# Patient Record
Sex: Female | Born: 1960 | Race: White | Hispanic: Yes | Marital: Married | Smoking: Former smoker
Health system: Southern US, Community
[De-identification: ages and names within clinical notes are randomized; demographics above are authoritative.]

## PROBLEM LIST (undated history)

## (undated) HISTORY — PX: EYE SURGERY: SHX253

## (undated) HISTORY — PX: ABDOMINAL HYSTERECTOMY: SHX81

---

## 2004-08-21 ENCOUNTER — Ambulatory Visit (HOSPITAL_COMMUNITY): Admission: RE | Admit: 2004-08-21 | Discharge: 2004-08-21 | Payer: Self-pay | Admitting: Internal Medicine

## 2004-10-02 ENCOUNTER — Other Ambulatory Visit: Admission: RE | Admit: 2004-10-02 | Discharge: 2004-10-02 | Payer: Self-pay | Admitting: Gynecology

## 2005-10-07 ENCOUNTER — Other Ambulatory Visit: Admission: RE | Admit: 2005-10-07 | Discharge: 2005-10-07 | Payer: Self-pay | Admitting: Gynecology

## 2005-10-21 ENCOUNTER — Encounter: Admission: RE | Admit: 2005-10-21 | Discharge: 2005-10-21 | Payer: Self-pay | Admitting: Gynecology

## 2005-12-12 ENCOUNTER — Other Ambulatory Visit: Admission: RE | Admit: 2005-12-12 | Discharge: 2005-12-12 | Payer: Self-pay | Admitting: Gynecology

## 2006-04-02 ENCOUNTER — Other Ambulatory Visit: Admission: RE | Admit: 2006-04-02 | Discharge: 2006-04-02 | Payer: Self-pay | Admitting: Gynecology

## 2006-04-10 ENCOUNTER — Encounter (INDEPENDENT_AMBULATORY_CARE_PROVIDER_SITE_OTHER): Payer: Self-pay | Admitting: *Deleted

## 2006-04-10 ENCOUNTER — Ambulatory Visit (HOSPITAL_BASED_OUTPATIENT_CLINIC_OR_DEPARTMENT_OTHER): Admission: RE | Admit: 2006-04-10 | Discharge: 2006-04-10 | Payer: Self-pay | Admitting: Gynecology

## 2006-06-19 ENCOUNTER — Encounter (INDEPENDENT_AMBULATORY_CARE_PROVIDER_SITE_OTHER): Payer: Self-pay | Admitting: Specialist

## 2006-06-19 ENCOUNTER — Inpatient Hospital Stay (HOSPITAL_COMMUNITY): Admission: RE | Admit: 2006-06-19 | Discharge: 2006-06-21 | Payer: Self-pay | Admitting: Gynecology

## 2006-10-10 ENCOUNTER — Other Ambulatory Visit: Admission: RE | Admit: 2006-10-10 | Discharge: 2006-10-10 | Payer: Self-pay | Admitting: Gynecology

## 2006-11-18 ENCOUNTER — Ambulatory Visit (HOSPITAL_COMMUNITY): Admission: RE | Admit: 2006-11-18 | Discharge: 2006-11-18 | Payer: Self-pay | Admitting: Gynecology

## 2007-11-11 ENCOUNTER — Other Ambulatory Visit: Admission: RE | Admit: 2007-11-11 | Discharge: 2007-11-11 | Payer: Self-pay | Admitting: Gynecology

## 2007-11-20 ENCOUNTER — Ambulatory Visit (HOSPITAL_COMMUNITY): Admission: RE | Admit: 2007-11-20 | Discharge: 2007-11-20 | Payer: Self-pay | Admitting: Gynecology

## 2007-12-07 ENCOUNTER — Emergency Department (HOSPITAL_COMMUNITY): Admission: EM | Admit: 2007-12-07 | Discharge: 2007-12-07 | Payer: Self-pay | Admitting: Emergency Medicine

## 2008-01-21 ENCOUNTER — Emergency Department (HOSPITAL_COMMUNITY): Admission: EM | Admit: 2008-01-21 | Discharge: 2008-01-21 | Payer: Self-pay | Admitting: Emergency Medicine

## 2008-10-18 ENCOUNTER — Other Ambulatory Visit: Admission: RE | Admit: 2008-10-18 | Discharge: 2008-10-18 | Payer: Self-pay | Admitting: Gynecology

## 2008-10-18 ENCOUNTER — Ambulatory Visit: Payer: Self-pay | Admitting: Gynecology

## 2008-10-18 ENCOUNTER — Encounter: Payer: Self-pay | Admitting: Gynecology

## 2008-12-12 ENCOUNTER — Ambulatory Visit: Payer: Self-pay | Admitting: Gynecology

## 2008-12-13 ENCOUNTER — Ambulatory Visit (HOSPITAL_COMMUNITY): Admission: RE | Admit: 2008-12-13 | Discharge: 2008-12-13 | Payer: Self-pay | Admitting: Gynecology

## 2009-02-07 ENCOUNTER — Ambulatory Visit: Payer: Self-pay | Admitting: Gynecology

## 2009-10-20 ENCOUNTER — Other Ambulatory Visit: Admission: RE | Admit: 2009-10-20 | Discharge: 2009-10-20 | Payer: Self-pay | Admitting: Gynecology

## 2009-10-20 ENCOUNTER — Ambulatory Visit: Payer: Self-pay | Admitting: Gynecology

## 2009-10-30 ENCOUNTER — Ambulatory Visit: Payer: Self-pay | Admitting: Gynecology

## 2009-11-08 ENCOUNTER — Ambulatory Visit: Payer: Self-pay | Admitting: Gynecology

## 2009-11-17 ENCOUNTER — Ambulatory Visit: Payer: Self-pay | Admitting: Gynecology

## 2009-12-15 ENCOUNTER — Ambulatory Visit (HOSPITAL_COMMUNITY): Admission: RE | Admit: 2009-12-15 | Discharge: 2009-12-15 | Payer: Self-pay | Admitting: Gynecology

## 2010-10-07 ENCOUNTER — Encounter: Payer: Self-pay | Admitting: Gynecology

## 2010-12-20 ENCOUNTER — Other Ambulatory Visit (HOSPITAL_COMMUNITY): Payer: Self-pay | Admitting: Family Medicine

## 2010-12-20 DIAGNOSIS — Z1231 Encounter for screening mammogram for malignant neoplasm of breast: Secondary | ICD-10-CM

## 2011-01-03 ENCOUNTER — Ambulatory Visit (HOSPITAL_COMMUNITY)
Admission: RE | Admit: 2011-01-03 | Discharge: 2011-01-03 | Disposition: A | Discharge status: Home / Self Care | Payer: Self-pay | Source: Ambulatory Visit | Attending: Family Medicine | Admitting: Family Medicine

## 2011-01-03 DIAGNOSIS — Z1231 Encounter for screening mammogram for malignant neoplasm of breast: Secondary | ICD-10-CM

## 2011-02-01 NOTE — H&P (Signed)
NAMERomona Mccann           ACCOUNT NO.:  [id number]   MEDICAL RECORD NO.:  [id number]          PATIENT TYPE:  AMB   LOCATION:                                FACILITY:  [facility]   PHYSICIAN:  Juan H. Lily Peer, M.D.DATE OF BIRTH:  [birth year]   DATE OF ADMISSION:  06/19/2006  DATE OF DISCHARGE:                                HISTORY & PHYSICAL   CHIEF COMPLAINT:  Chronic pelvic pain.  Persistent right ovarian cyst.   HISTORY:  The patient had been seen in the office on numerous occasions due  to ongoing chronic pelvic pain. She had diagnostic laparoscopy on July 26.  The patient had been complaining of continued bleeding and also left lower  quadrant pain although she had a right ovarian cyst.  Attempted left ovarian  cystectomy on July 26 was unsuccessful.  The patient was found to have an  obliterated cul-de-sac, bilateral hydrosalpinx and serosal endometriosis and  pelvic adhesions.  For this reason the patient was rescheduled for this  coming date for a total abdominal hysterectomy, left salpingo-oophorectomy  and possible bilateral salpingo-oophorectomy.   PAST MEDICAL HISTORY:  1. She denies any allergies.  2. No longer taking Prilosec that she was taking for GERD in the past.  3. She has had four vaginal deliveries and two D and Cs for miscarriages.   PHYSICAL EXAMINATION:  GENERAL:  Well-developed, well-nourished female. She  weighs 138 pounds.  HEENT:  Unremarkable.  NECK: Supple. Trachea midline. No carotid bruits.  No thyromegaly.  LUNGS: Clear to auscultation without any rhonchi or wheezes.  HEART: Regular rate and rhythm. No murmurs or gallops.  BREAST EXAM:  Done at the time of her annual examination was unremarkable.  ABDOMEN:  Soft and nontender. No rebound with the exception of tenderness in  the left lower quadrant.  PELVIC EXAM:  Bartholin, urethral, Skene's gland within normal limits.  Vagina and cervix with no unusual discharge. Uterus is anteverted.  Tenderness is toward the left side. Rigid cul-de-sac on palpation.  RECTAL EXAM:  Not done recently.   ASSESSMENT:  A 50 year old gravida 6, para 4, abortion 2 with chronic pelvic  pain, left greater than right. Ultrasound on March 29 demonstrated complex  cyst measuring 2.7 x 1.4 x 2.7 cm suspicious for endometrioma.  Recent  laparoscopy and attempted left ovarian cystectomy unsuccessful due to the  extensive endometriosis. The patient with obliterated cul-de-sac, bilateral  hydrosalpinges and serosal endometriosis and pelvic adhesions.  The patient  had had an endometrial biopsy in July 2007 due to her menorrhagia and it was  benign findings.  The patient did receive in the office on August 10 Depo-  Provera 150 mg IM and was given a prescription for Motrin 800 mg t.i.d.  p.r.n. and she was given Megace 20 mg to take one p.o. b.i.d. for 10 days to  help taper off her bleeding. The patient was counseled as to the risks,  benefits and pros and cons of hysterectomy to include infection although she  will receive prophylactic antibiotic, the risk of deep venous thrombosis and  pulmonary embolism. She will have PSC stockings.  Due to the findings of  endometriosis the patient will be placed on a bowel prep 24 hours before her  surgery. The patient also knows the potential risk of trauma to internal  organs, to the bladder, intestines, ureters and other internal structures.  She is also fully aware that she could potentially lose both tubes and  ovaries due to the extent of the endometriosis if it is encountered and also  urologic consultation may be entertained where they may need to place  ureteral stents depending on how the dissection goes.  If she were to have  both ovaries removed, she understands that she may need to be placed on  hormone replacement therapy for a few years to combat vasomotor symptoms.  All the above were discussed with the patient in Spanish in detail. All  questions  were answered and will follow accordingly.   PLAN:  The patient is scheduled for total abdominal hysterectomy with left  salpingo-oophorectomy, possible bilateral salpingo-oophorectomy on Thursday,  October 4 at 7:30 at [facility].      Eva H. Lily Peer, M.D.  Electronically Signed     JHF/MEDQ  D:  06/18/2006  T:  06/18/2006  Job:  [id number]

## 2011-02-01 NOTE — Op Note (Signed)
NAMEZAFIRO, ROUTSON            ACCOUNT NO.:  [id number]   MEDICAL RECORD NO.:  [id number]          PATIENT TYPE:  AMB   LOCATION:  [facility]                         FACILITY:  [facility]   PHYSICIAN:  Juan H. Lily Peer, M.D.DATE OF BIRTH:  04-12-1961   DATE OF PROCEDURE:  04/10/2006  DATE OF DISCHARGE:                                 OPERATIVE REPORT   SURGEON:  Juan H. Lily Peer, M.D.   FIRST ASSISTANT:  Timothy P. Fontaine, M.D.   INDICATIONS FOR OPERATION:  50 year old gravida 6, para 4, ab 2, who  presented is being taken to the operating room secondary to chronic pelvic  pain, left greater than right and persistent right ovarian cyst.  The  patient decided to proceed with a concurrent tubal sterilization procedure.   PREOPERATIVE DIAGNOSIS:  1.  Chronic pelvic pain.  2.  Right ovarian cyst.   POSTOPERATIVE DIAGNOSIS:  1.  Chronic pelvic pain.  2.  Right ovarian cyst.   1.  Pelvic adhesions.  2.  Bilateral hydrosalpinx.  3.  Severe endometriosis of cul-de-sac.   ANESTHESIA:  General endotracheal anesthesia.   SURGEON:  Juan H. Lily Peer, M.D.   FIRST ASSISTANT:  Colin Broach, MD   PROCEDURE PERFORMED:  1.  Diagnostic laparoscopy.  2.  Lysis of pelvic adhesions.  3.  Pelvic washings.   FINDINGS:  The patient had a slightly retroverted uterus encased and thick  adhesions.  Omentum was adhered to the fundal posterior aspect of the  uterus, bilateral hydrosalpinx and obliterated cul-de-sac and superficial  evidence of endometriosis.   DESCRIPTION OF OPERATION:  After the patient was adequately counseled, she  was taken to the operating room where she underwent successful general  endotracheal anesthesia.  The patient had PSA stockings for DVT prophylaxis  and she received a gram of Mefoxin prophylactically as well.  The patient  was placed in low lithotomy position.  A Foley catheter was then placed to  monitor urinary output.  The abdomen was prepped and draped in  usual sterile  fashion.  A small stab incision was made under the umbilicus and a 10 mm  trocar was inserted under laparoscopic view.  The trocar was left in place  and a second puncture site was made with a 5 mm trocar in the local right  lower abdomen five fingerbreadths from the midline under laparoscopic  guidance.  The second port site was made with a 5 mm trocar on the  contralateral side under direct visualization and underneath the trocar site  it was noticed that there was a bleeder occurring probably from a branch of  the epigastric artery.  The area was contained first by cauterization  through the contralateral laparoscopic port and then placing a 10 mm Foley  catheter in through the port and inflating and putting tension on it while  the operation was continued.  This stopped the bleeding altogether.  Inspection of the pelvic cavity then demonstrated an obliterated cul-de-sac,  thick omental adhesions to the fundal aspect of the uterus.  This was freed  with the tripolar cauterization unit to be able to visualize the uterus and  at this point it was noted that she had bilateral hydrosalpinx that were  obliterated in the cul-de-sac and the ovaries and tubes could not be freed.  The superficial surface of the uterus gave the appearance of underlying  endometriosis.  Pelvic washings were obtained.  There was no further  procedure that could be done at this time.  The patient will be scheduled  for a hysterectomy which will be the appropriate procedure at this time at a  later date.  Once the pelvic cavity was copiously irrigated with normal  saline solution, the Foley catheter was deflated, inspection of the port  site demonstrated no further bleeding.  The pneumoperitoneum that had been  placed initially during the operation was decreased and looking under direct  visualization, there was no bleeding.  Pressure was placed on the port site  and did not trigger any further  bleeding.  The abdomen was reinflated again.  The area inspected and once again deflated one more time under laparoscopic  view and there was no further bleeding.  After the CO2 was removed from the  pelvic cavity.  The instruments were removed.  At the left lower port site  skin was closed with interrupted sutures of 4-0 plain catgut suture and the  other three port sites were closed with Dermabond glue  0.25%Marcaine was  infiltrated for postoperative analgesia for total 10 mL.  The Hulka  tenaculum was removed that was placed initially before the operation to  manipulate the uterus during the laparoscopic procedure and the Foley  catheter was removed and the patient was transferred to recovery in stable  vital signs.  Her blood loss was 50 mL and IV fluids consisted of 1400 mL of  lactated Ringer's.      Juan H. Lily Peer, M.D.  Electronically Signed     JHF/MEDQ  D:  04/10/2006  T:  04/10/2006  Job:  [id number]

## 2011-02-01 NOTE — H&P (Signed)
[name], [name]            ACCOUNT NO.:  [id number]   MEDICAL RECORD NO.:  [id number]          PATIENT TYPE:   LOCATION:                                 FACILITY:   PHYSICIAN:  Juan H. Lily Peer, M.D.     DATE OF BIRTH:   DATE OF ADMISSION:  04/10/2006  DATE OF DISCHARGE:                                HISTORY & PHYSICAL   CHIEF COMPLAINT:  Chronic pelvic pin with persistent right ovarian cyst.   HISTORY:  The patient is a 50 year old gravida 6, para 4, AB2, who was seen  in the office for her annual gynecological examination in January of 2007.  The patient has been followed with serial ultrasounds secondary to chronic  pelvic pain.  She also was seen in the office on Feb 12, 2006 for a  preoperative consultation.  Back in March of this year, she had had some  irregular bleeding.  Some endometrial biopsy had been done, which was  normal, and her Pap smear, as well, had been normal.  The patient continued  to complain of dyspareunia and pelvic pain, but ironically, the patient  stated most of her pains on her left adnexa, although the persistent ovarian  cyst is on her right, which measures 2.7 x 1.4 x 2.7 cm.  The patient  decided to proceed with definitive surgery such as right ovarian cystectomy,  possible left salpingo-oophorectomy, possible bilateral salpingo-  oophorectomy with possible TAH/BSO.   PAST MEDICAL HISTORY:  1.  She denies any allergies.  2.  No longer taking the Prilosec that she was taking for GERD.  3.  She has had 4 vaginal deliveries and 2 D&Cs for miscarriages.   The patient can decide before the time of her surgery if she wants to  proceed also with laparoscopic sterilization at the same time as the  laparoscopy.   PHYSICAL EXAMINATION:  VITAL SIGNS:  The patient weighs 138 pounds.  HEENT:  Unremarkable.  NECK:  Supple.  Trachea midline.  No carotid bruits, no thyromegaly.  LUNGS:  Clear to auscultation without any rhonchi or wheezes.  HEART:   Regular rate and rhythm.  No murmurs or gallops.  BREAST EXAM:  Done at the time of her annual exam was unremarkable.  ABDOMEN:  Soft.  Some mild tenderness in the left lower quadrant, but no  rebound or guarding.  RECTAL:  Unremarkable.   ASSESSMENT:  A 50 year old gravida 6, para 4, AB2 with chronic pelvic pain,  left greater than right, persistent right ovarian cyst.  Scheduled to  undergo laparoscopic right ovarian cystectomy.  The patient to decide before  surgery if she wants to proceed with definitive sterilization to be  performed at the time of the laparoscopic cystectomy.  Suspect that probably  the pain in her left side and pelvis may be attributed to endometriosis.  If  that is encountered at that time, we will treat it with laser.  The patient  potentially could result in losing her right tube and ovary or both tubes  and ovaries or total abdominal hysterectomy/bilateral salpingo-oophorectomy.  The risk of the operation to include  infection, although she will receive  prophylactic antibiotics, the risk for DVT and subsequent pulmonary embolism  were also discussed.  The patient will have PSA stockings, as well.  Also,  in the event that the patient would need a blood transfusion, the potential  risk of anaphylactic reaction, hepatitis and AIDS were discussed, and  finally, in the event of any trauma to internal organs requiring correction,  may require an open laparotomy, of it technically the procedure is not able  to be completed laparoscopically, an open laparotomy technique may need to  be utilized, and will require the patient to stay in the hospital an extra  day.  All this was explained to the patient in Spanish.  All questions were  answered, and will follow accordingly.   PLAN:  The patient is scheduled for:  1.  Diagnostic laparoscopy.  2.  Right ovarian cystectomy.  3.  The patient to decide for bilateral tubal sterilization at the time of      presentation to  the hospital.      Gaetano Hawthorne. Lily Peer, M.D.  Electronically Signed     JHF/MEDQ  D:  04/10/2006  T:  04/10/2006  Job:  [id number]

## 2011-02-01 NOTE — Op Note (Signed)
NAMETRISTIN, GLADMAN            ACCOUNT NO.:  [id number]   MEDICAL RECORD NO.:  [id number]          PATIENT TYPE:  INP   LOCATION:  9310                          FACILITY:  [facility]   PHYSICIAN:  Juan H. Lily Peer, M.D.DATE OF BIRTH:  [birth year]   DATE OF PROCEDURE:  06/19/2006  DATE OF DISCHARGE:                                 OPERATIVE REPORT   SURGEON:  Juan H. Lily Peer, M.D.   ASSISTANT:  Rande Brunt. Eda Paschal, M.D.   INDICATIONS FOR OPERATION:  A 50 year old gravida 3, para 3, with a history  of chronic pelvic pain, especially left lower quadrant.  History of right  ovarian cyst.  Recent laparoscopy on July 26 demonstrated extensive stage IV  endometriosis with bilateral hydrosalpinx.  The patient being admitted today  to undergo a total abdominal hysterectomy with left salpingo-oophorectomy,  with bilateral salpingo-oophorectomy, for extensive stage IV endometriosis.   PREOPERATIVE DIAGNOSES:  1. Chronic pelvic pain.  2. Endometriosis.  3. Right ovarian cyst.   POSTOPERATIVE DIAGNOSIS:  1. Chronic pelvic pain.  2. Endometriosis.  3. Right ovarian cyst.  4. Pelvic adhesions.  5. Hydrosalpinx.   PROCEDURE PERFORMED:  Total abdominal hysterectomy with bilateral salpingo-  oophorectomy and extensive pelvic adhesiolysis.   ANESTHESIA:  General endotracheal anesthesia.   FINDINGS:  Patient with segments of small bowel adhered to the fundal region  of the uterus, bilateral hydrosalpinx.  Both ovaries were encased in thick  adhesions, retrouterine and pelvic sidewall.  Patient with apparent  hemosiderin deposits on the exterior surface of the uterine serosa,  especially in the anterior fundal region.  Normal-appearing appendix and  obliterated slightly obliterated cul-de-sac.   DESCRIPTION OF OPERATION:  After the patient was adequately counseled, she  was taken to the operating room, where she underwent a successful general  endotracheal anesthesia.  She had PSA stockings  for DVT prophylaxis and  received 2 g of cefoxitin IV.  The night before, the patient had had a bowel  prep due to the planned extensive dissection that could be required due to  the previously-diagnosed endometriosis on laparoscopy.  A Foley catheter was  in place to monitor urinary output.  The abdomen was prepped and draped in a  sterile fashion.  After general endotracheal anesthesia had been obtained, a  Pfannenstiel skin incision was made 2 cm above the symphysis pubis.  The  incision was carried down through the skin and subcutaneous tissue down to  the rectus fascia, whereby midline nick was made.  The fascia was incised in  a transverse fashion.  The peritoneal cavity was entered cautiously.  The  O'Connor retractors were placed after meticulous dissection.  A segment of  omentum that had found adhered to the fundus of the uterus was well as the  left pelvic sidewall had been freed.  Once the retractors were in place, a  systematic inspection demonstrated that both ovaries were encased in thick  adhesions, which were uterine and to the pelvic sidewall.  Also, hemosiderin  deposits were noted on the surface of the serosa of the uterus and the  fundus.  Attention was placed to the  left round ligament, which was  transected.  The anterior broad ligament was incised to the level of the  internal cervical os.  The left ureter was identified, then meticulous  dissection of the parametrial tissue, keeping the ureter identified at all  times.  An umbilical tape was used to demarcate the left ureter away from  the dissection plane so that the infundibulopelvic ligament could be clamped  and transected.  The Gyrus open-seal forceps were utilized with the  generator set at 60 watts for the left infundibulopelvic ligament, and this  was clamped and cauterized x2 and then transected with the scissors.  The  remainder of the broad ligament and cardinal ligaments were serially  clamped,  cauterized and cut to the level of left uterine artery.  A similar  procedure was carried out on the contralateral side.  With the use of  Jorgenson scissors, the cervix was excised from the vagina and the cervix,  tubes and ovary were passed off the operative field.  Both angles were  secured with a figure-of-eight of 0 Vicryl suture and the remaining cuff was  secured with interrupted sutures of 0 Vicryl suture as well.  The pelvic  cavity was copiously irrigated with normal saline solution and small area  near the cuff was oozing, and three Hemoclips were used to contain it at all  times, keeping close attention to the peristalsis of the ureter being away  from this area as well.  Once hemostasis was established, sponge count and  needle count were correct.  Appendix was inspected and appeared to be  normal.  The pelvic cavity had been copiously irrigated with normal saline  solution.  Again, the sponge count and needle count were correct.  The  visceral peritoneum was not closed, but the rectus fascia was closed with a  running stitch of 0 Vicryl suture.  The subcutaneous bleeders were Bovie  cauterized and the skin was reapproximated with skin clips, followed by  placing Xeroform gauze, a 4 x 8 dressing.  The patient was extubated,  transferred to the recovery room with stable vital signs.  Blood loss from  the procedure was 200 mL.  IV fluids 1800 mL of lactated Ringer's.  Urine  output 300 mL and clear.  She did have PSA stockings for DVT prophylaxis and  received 2 g of cefoxitin IV prophylactically as well.      Juan H. Lily Peer, M.D.  Electronically Signed     JHF/MEDQ  D:  06/19/2006  T:  06/20/2006  Job:  [id number]

## 2011-02-01 NOTE — Discharge Summary (Signed)
[name], [name]            ACCOUNT NO.:  [id number]   MEDICAL RECORD NO.:  [id number]          PATIENT TYPE:  INP   LOCATION:  9310                          FACILITY:  [facility]   PHYSICIAN:  Timothy P. Fontaine, M.D.DATE OF BIRTH:  08/06/1961   DATE OF ADMISSION:  06/19/2006  DATE OF DISCHARGE:  06/21/2006                                 DISCHARGE SUMMARY   DISCHARGE DIAGNOSES:  1. Chronic pelvic pain.  2. Endometriosis.  3. Hydrosalpinx bilaterally.  4. Pelvic adhesive disease.   PROCEDURES:  1. Total abdominal hysterectomy.  2. Bilateral salpingo-oophorectomy.  3. Lysis of pelvic adhesions.  Dr. Reynaldo Minium June 19, 2006,      pathology pending.   HOSPITAL COURSE:  A 50 year old G45, P4 female underwent uncomplicated total  abdominal hysterectomy, bilateral salpingo-oophorectomy, lysis of pelvic  adhesions June 19, 2006.  The patient's postoperative course was  uncomplicated.  She was discharged on postoperative day #2 ambulating well,  tolerating a regular diet, voiding without difficulty with a postoperative  hemoglobin of 10.9.  The patient received precautions, instructions and  follow-up.  Will be seen in the office 2-3 days following discharge for  removal of her staples.  She was instructed to call the office to schedule  this appointment.  She was given discharge prescriptions per Dr. Reynaldo Minium to include Lortab 7.5 #30 one p.o. q.4-6h. p.r.n. pain, Climara  transdermal patch 0.1 mg #4 one patch weekly and Reglan 10 mg #25 one p.o.  q.4-6h. p.r.n. nausea.      Timothy P. Fontaine, M.D.  Electronically Signed     TPF/MEDQ  D:  06/21/2006  T:  06/22/2006  Job:  [id number]

## 2011-06-10 LAB — POCT I-STAT, CHEM 8
BUN: 14
Calcium, Ion: 1.24
Chloride: 104
Creatinine, Ser: 0.7
Glucose, Bld: 118 — ABNORMAL HIGH
HCT: 46
Hemoglobin: 15.6 — ABNORMAL HIGH
Potassium: 3.3 — ABNORMAL LOW
Sodium: 140
TCO2: 26

## 2011-06-10 LAB — POCT CARDIAC MARKERS
CKMB, poc: 1.4
CKMB, poc: 1.5
Myoglobin, poc: 31.3
Myoglobin, poc: 41.5
Operator id: 272551
Operator id: 277751
Troponin i, poc: <0.05
Troponin i, poc: <0.05

## 2012-01-28 ENCOUNTER — Other Ambulatory Visit (HOSPITAL_COMMUNITY): Payer: Self-pay | Admitting: Geriatric Medicine

## 2012-01-28 DIAGNOSIS — Z1231 Encounter for screening mammogram for malignant neoplasm of breast: Secondary | ICD-10-CM

## 2012-02-26 ENCOUNTER — Ambulatory Visit (HOSPITAL_COMMUNITY)
Admission: RE | Admit: 2012-02-26 | Discharge: 2012-02-26 | Disposition: A | Discharge status: Home / Self Care | Payer: Self-pay | Source: Ambulatory Visit | Attending: Geriatric Medicine | Admitting: Geriatric Medicine

## 2012-02-26 DIAGNOSIS — Z1231 Encounter for screening mammogram for malignant neoplasm of breast: Secondary | ICD-10-CM

## 2012-11-27 ENCOUNTER — Other Ambulatory Visit (HOSPITAL_COMMUNITY): Payer: Self-pay | Admitting: Geriatric Medicine

## 2012-11-27 DIAGNOSIS — Z1231 Encounter for screening mammogram for malignant neoplasm of breast: Secondary | ICD-10-CM

## 2012-12-11 ENCOUNTER — Other Ambulatory Visit (HOSPITAL_COMMUNITY): Payer: Self-pay | Admitting: Geriatric Medicine

## 2012-12-11 ENCOUNTER — Ambulatory Visit (HOSPITAL_COMMUNITY)
Admission: RE | Admit: 2012-12-11 | Discharge: 2012-12-11 | Disposition: A | Discharge status: Home / Self Care | Payer: Self-pay | Source: Ambulatory Visit | Attending: Geriatric Medicine | Admitting: Geriatric Medicine

## 2012-12-11 DIAGNOSIS — Z1231 Encounter for screening mammogram for malignant neoplasm of breast: Secondary | ICD-10-CM

## 2013-03-01 ENCOUNTER — Ambulatory Visit (HOSPITAL_COMMUNITY)
Admission: RE | Admit: 2013-03-01 | Discharge: 2013-03-01 | Disposition: A | Discharge status: Home / Self Care | Payer: Self-pay | Source: Ambulatory Visit | Attending: Geriatric Medicine | Admitting: Geriatric Medicine

## 2013-03-01 DIAGNOSIS — Z1231 Encounter for screening mammogram for malignant neoplasm of breast: Secondary | ICD-10-CM

## 2014-02-09 ENCOUNTER — Other Ambulatory Visit (HOSPITAL_COMMUNITY): Payer: Self-pay | Admitting: Geriatric Medicine

## 2014-02-24 ENCOUNTER — Other Ambulatory Visit (HOSPITAL_COMMUNITY): Payer: Self-pay | Admitting: Geriatric Medicine

## 2014-02-24 DIAGNOSIS — Z1231 Encounter for screening mammogram for malignant neoplasm of breast: Secondary | ICD-10-CM

## 2014-03-02 ENCOUNTER — Ambulatory Visit (HOSPITAL_COMMUNITY)
Admission: RE | Admit: 2014-03-02 | Discharge: 2014-03-02 | Disposition: A | Discharge status: Home / Self Care | Payer: Self-pay | Source: Ambulatory Visit | Attending: Geriatric Medicine | Admitting: Geriatric Medicine

## 2014-03-02 DIAGNOSIS — Z1231 Encounter for screening mammogram for malignant neoplasm of breast: Secondary | ICD-10-CM

## 2015-03-07 ENCOUNTER — Other Ambulatory Visit (HOSPITAL_COMMUNITY): Payer: Self-pay | Admitting: Specialist

## 2015-03-07 DIAGNOSIS — Z1231 Encounter for screening mammogram for malignant neoplasm of breast: Secondary | ICD-10-CM

## 2015-03-14 ENCOUNTER — Ambulatory Visit (HOSPITAL_COMMUNITY)
Admission: RE | Admit: 2015-03-14 | Discharge: 2015-03-14 | Disposition: A | Discharge status: Home / Self Care | Payer: Self-pay | Source: Ambulatory Visit | Attending: Specialist | Admitting: Specialist

## 2015-03-14 DIAGNOSIS — Z1231 Encounter for screening mammogram for malignant neoplasm of breast: Secondary | ICD-10-CM

## 2016-03-05 ENCOUNTER — Other Ambulatory Visit: Payer: Self-pay | Admitting: Obstetrics and Gynecology

## 2016-03-05 DIAGNOSIS — Z1231 Encounter for screening mammogram for malignant neoplasm of breast: Secondary | ICD-10-CM

## 2016-03-15 ENCOUNTER — Ambulatory Visit
Admission: RE | Admit: 2016-03-15 | Discharge: 2016-03-15 | Disposition: A | Discharge status: Home / Self Care | Payer: No Typology Code available for payment source | Source: Ambulatory Visit | Attending: Obstetrics and Gynecology | Admitting: Obstetrics and Gynecology

## 2016-03-15 ENCOUNTER — Ambulatory Visit (HOSPITAL_COMMUNITY)
Admission: RE | Admit: 2016-03-15 | Discharge: 2016-03-15 | Disposition: A | Discharge status: Home / Self Care | Payer: Self-pay | Source: Ambulatory Visit | Attending: Obstetrics and Gynecology | Admitting: Obstetrics and Gynecology

## 2016-03-15 ENCOUNTER — Encounter (HOSPITAL_COMMUNITY): Payer: Self-pay

## 2016-03-15 VITALS — BP 118/80 | Temp 97.8°F | Ht 62.0 in | Wt 145.6 lb

## 2016-03-15 DIAGNOSIS — Z1231 Encounter for screening mammogram for malignant neoplasm of breast: Secondary | ICD-10-CM

## 2016-03-15 DIAGNOSIS — Z1239 Encounter for other screening for malignant neoplasm of breast: Secondary | ICD-10-CM

## 2016-03-15 NOTE — Patient Instructions (Signed)
Educational material on self breast awareness given. Explained to [name] did not need a Pap smear today due to patient has a history of a hysterectomy for benign reasons. Let patient know that she doesn't need any further Pap smears due to her history of a hysterectomy for benign reasons. [facility]. Appointment scheduled for Friday, March 15, 2016 at 1010. Let patient know the Breast Center will follow up with her within the next couple weeks with results of mammogram by letter or phone. [name] verbalized understanding.  Jethro Radke, Kathaleen Maserhristine Poll, RN 10:48 AM

## 2016-03-15 NOTE — Progress Notes (Signed)
Complaints of right back pain that radiates to her arm and then her breast that started yesterday. Patient stated she was mopping yesterday and the pain started afterwards. Patient rates pain at a 5 out of 10.  Pap Smear:  Pap smear not completed today. Last Pap smear was in 2007 and normal per patient. Per patient has no history of an abnormal Pap smear. Patient has a history of a hysterectomy 06/19/2006 for endometriosis. Patient no longer needs Pap smears due to her history of a hysterectomy for benign reasons per BCCCP and ACOG guidelines.  Physical exam: Breasts Breasts symmetrical. No skin abnormalities bilateral breasts. No nipple retraction bilateral breasts. No nipple discharge bilateral breasts. No lymphadenopathy. No lumps palpated bilateral breasts. No complaints of pain or tenderness on exam. [facility] for [facility] screening mamm[facility]. Appointment scheduled for Friday, March 15, 2016 at 1010.  Pelvic/Bimanual No Pap smear completed today since patient has history of a hysterectomy for benign reasons. Pap smear not indicated per BCCCP guidelines.   Smoking History: Patient has never smoked.  Patient Navigation: Patient education provided. Access to services provided for [facility] through [facility]. Spanish interpreter provided.  Colorectal Cancer Screening: Patient has never had a colonoscopy. No complaints today..  Used Spanish interpreter [employer] Gal[employer] from [employer].

## 2016-03-18 ENCOUNTER — Encounter (HOSPITAL_COMMUNITY): Payer: Self-pay | Admitting: *Deleted

## 2016-03-27 ENCOUNTER — Telehealth: Payer: Self-pay

## 2016-03-27 NOTE — Telephone Encounter (Signed)
Callled per Albertina SenegalMarly Adams to remind of [facility] 03[facility]. Patient stated would be there.

## 2016-03-29 ENCOUNTER — Ambulatory Visit: Payer: Self-pay

## 2016-03-29 ENCOUNTER — Other Ambulatory Visit: Payer: Self-pay

## 2016-03-29 VITALS — BP 122/88 | HR 60 | Temp 97.9°F | Resp 16 | Ht 59.84 in | Wt 147.0 lb

## 2016-03-29 DIAGNOSIS — Z0000 Encounter for general adult medical examination without abnormal findings: Secondary | ICD-10-CM

## 2016-03-29 LAB — LIPID PANEL
Chol/HDL Ratio: 4.5 ratio units — ABNORMAL HIGH (ref 0.0–4.4)
Cholesterol, Total: 254 mg/dL — ABNORMAL HIGH (ref 100–199)
HDL: 57 mg/dL (ref >39)
LDL Calculated: 159 mg/dL — ABNORMAL HIGH (ref 0–99)
Triglycerides: 189 mg/dL — ABNORMAL HIGH (ref 0–149)
VLDL Cholesterol Cal: 38 mg/dL (ref 5–40)

## 2016-03-29 NOTE — Progress Notes (Signed)
[facility] and is currently a [facility] patient effective 03/15/2016. Patient is accompanied by interpreter Albertina SenegalMarly Adams.  Clinical Measurements: Patient is 4 ft. 11.8 inches, weight 147 lbs, and BMI 28.9.   Medical History: Patient has no history of high cholesterol. Patient does not have a history of hypertension or diabetes. Per patient no diagnosed history of coronary heart disease, heart attack, heart failure, stroke/TIA, vascular disease or congenital heart defects.   Blood Pressure, Self-measurement: Patient states has no reason to check Blood pressure.  Nutrition Assessment: Patient stated that eats 2 to 3 fruits every day. Patient states she eats 2 servings of vegetables a day. Per patient does eat 3 or more ounces of whole grains daily. Patient doesn't eat two or more servings of fish weekly.  Patient states she does not drink more than 36 ounces or 450 calories of beverages with added sugars weekly. Patient states she drinks a lot of water. Patient stated she does  watch her salt intake.   Physical Activity Assessment: Patient stated she walks 5 miles 3 days a week. Patient stated has probably been doing 180 minutes of moderate exercise a week and no vigorous exercise.  Smoking Status: Patient stated that she sporadically smokes and is not exposed to smoke.  Quality of Life Assessment: In assessing patient's physical quality of life she stated that out of the past 30 days that she has felt her health was good all days. Patient also stated that in the past 30 days that her mental health was good including stress, depression and problems with emotions for all days. Patient did state that out of the past 30 days she felt her physical or mental health had not kept her from doing her usual activities including self-care, work or recreation for all days.   Plan: Lab work will be done today including a lipid panel and Hgb A1C. Will call lab results when they  are finished. Patient will receive Health Coaching for risk reduction initially and then as patient needs.  FIRST HEALTH COACH: During initial screening with Albertina SenegalMarly Adams interpreter present, wellness health coaching was started.  Nutrition: Discussed with patient that normally should eat 2 to 3 servings of fruits and 2 to 21/2 of vegetables a day with a serving being 1/2 cup or small piece. Informed patient that two servings of fish were recommended per week, being ones with omega threes. Listed the fishes with omega three's for patient.Discussed that should have 5 to 7 ounces of grains a day. Explained to patient that should eat fruits and vegetables high in fiber but 3 of her 5 to 7 grains of fiber should be whole grain. Examples of whole grains were given.   Informed about sugar which patient is doing. Discussed decreasing salt intake by mainly adding some while cook and none at table.  Physical Activity:  Patient was Informed that minimal moderate activity is 150 minutes a week or 75 minutes of vigorous. Patient is doing her minimal moderate exercise. Discussed adding in some flexeibily and balance exercises.  PLAN: Will set goal when call lab results and do second health coaching. Will think about what goals may want to set.

## 2016-03-29 NOTE — Patient Instructions (Addendum)
Discussed health assessment with patient. Will increase omega threes in daily patterns. She will be called with results of lab work and we will then discussed any further follow up the patient needs. Patient verbalized understanding.

## 2016-03-30 LAB — HEMOGLOBIN A1C
Est. average glucose Bld gHb Est-mCnc: 120 mg/dL
Hemoglobin A1c: 5.8 % — ABNORMAL HIGH (ref 4.8–5.6)

## 2016-04-01 ENCOUNTER — Telehealth: Payer: Self-pay

## 2016-04-01 NOTE — Telephone Encounter (Signed)
LAB RESULTS: Patient called per [name] interpreter to inform about lab work from 03/29/16. Interpreter informed patient: cholesterol- 254, HDL- 57, LDL- 159, triglycerides - 161189,  HBG-A1C - 5.8, and BMI 28.9.  DOCTOR's APPOINTMENT: [facility] at 3 pM for elevated blood lipid panel. Reassured patient that doctor's appointment is concerning Lipid panel results. Informed that would leave packet at reception with Facility address, infomation and telephone number given. Patient was informed not to let doctor do any other lab work or injections. [facility] does not cover and patient would be billed.   SECOND HEALTH COACHING: Did risk reduction counseling/Heach Coach concerning related to BMI/Weight, cholesterol, Triglycerides, and LDL's. Patient and I discussed her eating and exercise. Discussed serving sizes. Discussed not to eat over 6 to 7 serving sizes per day. Need to decrease sugar and eat food higher fiber. Need to measure and know what serving sizes are.  NAVIGATION NEEDS ASSESSMENT AND CARE PLAN: Patient does haven support of husband but does need additional social support. Patient is not aware of services in the area so has a lack access to services.Patient understands why she has to be followed up for Lipid Panel results. Only other barrier may be that patient is not aware of some services to assist the low income and language deficit.  PLAN: Patient will attend doctor's appointment. Will call patient back after appointment to see how appointment went. Patient and I will discuss more health coaching concerning patients exercise and weight loss goals

## 2016-04-05 ENCOUNTER — Ambulatory Visit (INDEPENDENT_AMBULATORY_CARE_PROVIDER_SITE_OTHER): Payer: Self-pay | Admitting: Student in an Organized Health Care Education/Training Program

## 2016-04-05 ENCOUNTER — Encounter: Payer: Self-pay | Admitting: Student in an Organized Health Care Education/Training Program

## 2016-04-05 VITALS — BP 116/93 | HR 81 | Temp 98.3°F | Ht 60.0 in | Wt 148.0 lb

## 2016-04-05 DIAGNOSIS — R7303 Prediabetes: Secondary | ICD-10-CM

## 2016-04-05 DIAGNOSIS — E789 Disorder of lipoprotein metabolism, unspecified: Secondary | ICD-10-CM | POA: Insufficient documentation

## 2016-04-05 NOTE — Assessment & Plan Note (Addendum)
ASCVD Risk estimated 1.6%; based on 10 year risk not in statin benefit group - patient education on elevated cholesterol, long-term risks and management -  Lifestyle changes are indicated at this time; patient given information on high-cholesterol food to avoid - Patient will attempt implement exercise regimen: walk 30 mins x 3d/week

## 2016-04-05 NOTE — Progress Notes (Signed)
   CC: Follow up abnormal result on cholesterol screen  HPI: [name] is a 55 y.o. female [facility] after having elevated cholesterol and A1C on screening tests.  Patient is asymptomatic.  She denies chest pain, dyspnea on exertion, or claudication.  No vision changes, polyuria, polydipsia by history.  She does not exercise.    Patient denies any history of medical conditions, reports she takes no medications.   Review of Symptoms: See HPI for ROS.   CC, SH/smoking status, and VS noted. Patient is a non-smoker.   Objective: BP 116/93 mmHg  Pulse 81  Temp(Src) 98.3 F (36.8 C) (Oral)  Ht 5' (1.524 m)  Wt 148 lb (67.132 kg)  BMI 28.90 kg/m2 GEN: NAD, alert, cooperative, and pleasant. RESPIRATORY: clear to auscultation bilaterally with no wheezes, rhonchi or rales, good effort CV: RRR, no m/r/g, no peripheral edema GI: soft, non-tender, non-distended SKIN: warm and dry PSYCH: AAOx3, appropriate affect  Assessment and plan:  Abnormal cholesterol test ASCVD Risk estimated 1.6%; based on 10 year risk not in statin benefit group - patient education on elevated cholesterol, long-term risks and management -  Lifestyle changes are indicated at this time; patient given information on high-cholesterol food to avoid - Patient will attempt implement exercise regimen: walk 30 mins x 3d/week   Prediabetes HbA1C 5.8 on screening test. - No pharmacologic intervention indicated at this time   - Patient educated on diabetes long-term risks and her A1C results - Discussed management with lifestyle changes - diet and exercise - Used "My Plate" educational tool to help patient with diet decisions going forward    No orders of the defined types were placed in this encounter.    No orders of the defined types were placed in this encounter.     Howard PouchLauren Dustin Bumbaugh, MD,MS,  PGY1 04/05/2016 4:30 PM

## 2016-04-05 NOTE — Assessment & Plan Note (Addendum)
HbA1C 5.8 on screening test. - No pharmacologic intervention indicated at this time   - Patient educated on diabetes long-term risks and her A1C results - Discussed management with lifestyle changes - diet and exercise - Used "My Plate" educational tool to help patient with diet decisions going forward

## 2016-04-05 NOTE — Patient Instructions (Addendum)
It was a pleasure seeing you today in our clinic. Today we discussed cholesterol and diabetes. Here is the treatment plan we have discussed and agreed upon together: - incorporate more exercise into your week, try to exercise 30 minutes 3 days per week. - we talked about dietary changes to lower cholesterol, and also improve your HbA1C. - please ask for a meeting with financial counseling to set up your orange card so that we can continue to follow you for health maintenance.  Our clinic's number is [phone]. Feel free with questions or concern about what we discussed today.   - Dr. Estill BattenFeng     Colesterol (Cholesterol) El colesterol esuna grasa. El organismo necesita una pequea cantidad de colesterol. El colesterol puede acumularse en los vasos sanguneos, lo que aumenta la probabilidad de tener un ataque cardaco o un ictus. Los niveles de colesterol no pueden percibirse. La nica forma de saber que el colesterol est alto es mediante un anlisis de Waurikasangre. Guarde los CDW Corporationresultados del anlisis. Trabaje con el mdico para Engineer, maintenance (IT)mantener el colesterol en un nivel adecuado. QU SIGNIFICAN LOS RESULTADOS DEL ANLISIS?  El colesterol total es la cantidad de colesterol que hay en la Westwaysangre.  El LDL es el colesterol Ripleymalo, y es el tipo que puede acumularse. Lo deseable es mantenerlo bajo.  El HDL es el colesterol bueno. Limpia los vasos sanguneos y elimina el colesterol LDL. Lo deseable es que el colesterol HDL sea alto.  Los triglicridos son grasas que el organismo puede Evening Shadequemar, ya sea para energa o para almacenamiento. CULES SON LOS NIVELES BUENOS DE COLESTEROL?  El colesterol total por debajo de 200.  El LDL por debajo de 100 en las personas que corren riesgo. Por debajo de 70 en las personas que corren un riesgo muy alto.  El HDL por encima de 50 es aceptable. Por encima de 60 es mejor.  Los triglicridos por debajo de 150. CMO PUEDO BAJAR EL COLESTEROL?  Dieta. Siga los programas  de alimentacin que el mdico le indique.  Elija el pescado, la carne blanca de pollo, el pavo asado u horneado. Intente no comer carnes rojas, alimentos fritos o carnes procesadas, como salchichas y embutidos.  Coma gran cantidad de frutas y verduras frescas.  Elija los cereales integrales, los frijoles, las pastas, las papas y los cereales.  Use solamente pequeas cantidades de aceite de oliva, maz o canola.  Intente no comer Faywoodmantequilla, Oberlinmayonesa, Indiamargarina o aceites de Harperpalmiste.  Intente no comer alimentos que contengan grasas trans.  Beba leche descremada o sin grasa. Coma yogur y quesos bajos en Steffanie Rainwatergrasa o sin grasa. Intente no consumir leche entera o crema. Intente no comer helados, yemas de huevo y quesos enteros.  Los postres sanos incluyen la torta ngel, los bocadillos de Dundeejengibre, las Gaffergalletas con forma de Winonaanimales, los caramelos duros, los helados de agua y el yogur bajo en grasa o sin grasa. Intente no comer masas, tortas, pasteles y galletas.  Haga actividad fsica. Siga el programa de actividad fsica segn las indicaciones del mdico.  Sea ms Overtonactivo. Puede intentar hacer jardinera, hacer caminatas o subir las escaleras. Consulte al mdico cmo puede aumentar la Hooperactividad.  Medicamentos. Tome los United Parcelmedicamentos como le indic su mdico.   Esta informacin no tiene Theme park managercomo fin reemplazar el consejo del mdico. Asegrese de hacerle al mdico cualquier pregunta que tenga.   Document Released: 10/05/2010 Document Revised: 09/23/2014 Elsevier Interactive Patient Education [employer]2016 Elsevier Inc.

## 2016-04-28 ENCOUNTER — Encounter (HOSPITAL_COMMUNITY): Payer: Self-pay

## 2016-05-08 ENCOUNTER — Ambulatory Visit: Payer: No Typology Code available for payment source | Attending: Internal Medicine

## 2016-05-20 ENCOUNTER — Emergency Department (HOSPITAL_COMMUNITY): Payer: Self-pay

## 2016-05-20 ENCOUNTER — Encounter (HOSPITAL_COMMUNITY): Payer: Self-pay | Admitting: Vascular Surgery

## 2016-05-20 ENCOUNTER — Inpatient Hospital Stay (HOSPITAL_COMMUNITY)
Admission: EM | Admit: 2016-05-20 | Discharge: 2016-05-24 | DRG: 418 | Disposition: A | Discharge status: Home / Self Care | Payer: Self-pay | Attending: Family Medicine | Admitting: Family Medicine

## 2016-05-20 DIAGNOSIS — R112 Nausea with vomiting, unspecified: Secondary | ICD-10-CM

## 2016-05-20 DIAGNOSIS — K219 Gastro-esophageal reflux disease without esophagitis: Secondary | ICD-10-CM | POA: Diagnosis present

## 2016-05-20 DIAGNOSIS — K8020 Calculus of gallbladder without cholecystitis without obstruction: Secondary | ICD-10-CM

## 2016-05-20 DIAGNOSIS — Z90710 Acquired absence of both cervix and uterus: Secondary | ICD-10-CM

## 2016-05-20 DIAGNOSIS — R911 Solitary pulmonary nodule: Secondary | ICD-10-CM | POA: Diagnosis present

## 2016-05-20 DIAGNOSIS — R918 Other nonspecific abnormal finding of lung field: Secondary | ICD-10-CM

## 2016-05-20 DIAGNOSIS — K851 Biliary acute pancreatitis without necrosis or infection: Principal | ICD-10-CM | POA: Diagnosis present

## 2016-05-20 DIAGNOSIS — K8010 Calculus of gallbladder with chronic cholecystitis without obstruction: Secondary | ICD-10-CM | POA: Diagnosis present

## 2016-05-20 DIAGNOSIS — K859 Acute pancreatitis without necrosis or infection, unspecified: Secondary | ICD-10-CM | POA: Diagnosis present

## 2016-05-20 DIAGNOSIS — K810 Acute cholecystitis: Secondary | ICD-10-CM

## 2016-05-20 DIAGNOSIS — R1110 Vomiting, unspecified: Secondary | ICD-10-CM | POA: Insufficient documentation

## 2016-05-20 DIAGNOSIS — R1013 Epigastric pain: Secondary | ICD-10-CM | POA: Insufficient documentation

## 2016-05-20 DIAGNOSIS — Z419 Encounter for procedure for purposes other than remedying health state, unspecified: Secondary | ICD-10-CM

## 2016-05-20 LAB — URINALYSIS, ROUTINE W REFLEX MICROSCOPIC
Bilirubin Urine: NEGATIVE
Glucose, UA: NEGATIVE mg/dL
HGB URINE DIPSTICK: NEGATIVE
Ketones, ur: NEGATIVE mg/dL
Leukocytes, UA: NEGATIVE
NITRITE: NEGATIVE
Protein, ur: NEGATIVE mg/dL
SPECIFIC GRAVITY, URINE: 1.019 (ref 1.005–1.030)
pH: 5 (ref 5.0–8.0)

## 2016-05-20 LAB — CBC
HCT: 39.9 % (ref 36.0–46.0)
HEMOGLOBIN: 13.3 g/dL (ref 12.0–15.0)
MCH: 29.6 pg (ref 26.0–34.0)
MCHC: 33.3 g/dL (ref 30.0–36.0)
MCV: 88.9 fL (ref 78.0–100.0)
Platelets: 232 10*3/uL (ref 150–400)
RBC: 4.49 MIL/uL (ref 3.87–5.11)
RDW: 13.2 % (ref 11.5–15.5)
WBC: 9.2 10*3/uL (ref 4.0–10.5)

## 2016-05-20 LAB — COMPREHENSIVE METABOLIC PANEL
ALK PHOS: 89 U/L (ref 38–126)
ALT: 207 U/L — ABNORMAL HIGH (ref 14–54)
ANION GAP: 6 (ref 5–15)
AST: 424 U/L — ABNORMAL HIGH (ref 15–41)
Albumin: 3.9 g/dL (ref 3.5–5.0)
BUN: 14 mg/dL (ref 6–20)
CALCIUM: 9 mg/dL (ref 8.9–10.3)
CO2: 24 mmol/L (ref 22–32)
Chloride: 109 mmol/L (ref 101–111)
Creatinine, Ser: 0.79 mg/dL (ref 0.44–1.00)
GFR calc non Af Amer: >60 mL/min (ref >60)
Glucose, Bld: 158 mg/dL — ABNORMAL HIGH (ref 65–99)
Potassium: 3.9 mmol/L (ref 3.5–5.1)
SODIUM: 139 mmol/L (ref 135–145)
Total Bilirubin: 1.2 mg/dL (ref 0.3–1.2)
Total Protein: 6.9 g/dL (ref 6.5–8.1)

## 2016-05-20 LAB — LIPASE, BLOOD: LIPASE: 6600 U/L — AB (ref 11–51)

## 2016-05-20 MED ORDER — SODIUM CHLORIDE 0.9 % IV BOLUS (SEPSIS)
1000.0000 mL | Freq: Once | INTRAVENOUS | Status: AC
  Administered 2016-05-20: 1000 mL via INTRAVENOUS

## 2016-05-20 MED ORDER — ONDANSETRON HCL 4 MG/2ML IJ SOLN
4.0000 mg | Freq: Once | INTRAMUSCULAR | Status: AC
  Administered 2016-05-20: 4 mg via INTRAVENOUS
  Filled 2016-05-20: qty 2

## 2016-05-20 MED ORDER — IOPAMIDOL (ISOVUE-300) INJECTION 61%
INTRAVENOUS | Status: AC
  Administered 2016-05-20: 100 mL
  Filled 2016-05-20: qty 100

## 2016-05-20 MED ORDER — HYDROMORPHONE HCL 1 MG/ML IJ SOLN
1.0000 mg | Freq: Once | INTRAMUSCULAR | Status: AC
  Administered 2016-05-20: 1 mg via INTRAVENOUS
  Filled 2016-05-20: qty 1

## 2016-05-20 NOTE — ED Notes (Signed)
Pt ambulated to bathroom, tolerated well.

## 2016-05-20 NOTE — ED Notes (Signed)
Attempted to call report

## 2016-05-20 NOTE — ED Provider Notes (Signed)
[facility]-EMERGENCY DEPT Provider Note   CSN: [id number] Arrival date & time: 05/20/16  1654     History   Chief Complaint Chief Complaint  Patient presents with  . Abdominal Pain    HPI [name] is a 55 y.o. female presenting with abdominal pain. Started around 12 noon today. Patient speaks Spanish, daughter is translating. About 30 minutes after eating a meal she developed pain. Has never had pain like this before. Some chest pressure but mostly in her upper abdomen. Has vomited 3 times, currently nauseated. No diarrhea. Pain wraps around to her back. No known liver or gallbladder problem. Seldomly drinks alcohol. Pain is severe.   HPI  History reviewed. No pertinent past medical history.  Patient Active Problem List   Diagnosis Date Noted  . Acute pancreatitis 05/20/2016  . Abnormal cholesterol test 04/05/2016  . Prediabetes 04/05/2016    Past Surgical History:  Procedure Laterality Date  . EYE SURGERY      OB History    Gravida Para Term Preterm AB Living   6 4 4   2 4    SAB TAB Ectopic Multiple Live Births   2               Home Medications    Prior to Admission medications   Medication Sig Start Date End Date Taking? Authorizing Provider  Calcium Carbonate-Vitamin D (CALTRATE 600+D PO) Take 1 tablet by mouth at bedtime.   Yes Historical Provider, MD  Cyanocobalamin (VITAMIN B-12 PO) Take 1 tablet by mouth at bedtime.   Yes Historical Provider, MD  Omega-3 Fatty Acids (OMEGA 3 PO) Take 1 capsule by mouth at bedtime.   Yes Historical Provider, MD  OVER THE COUNTER MEDICATION Place 1 drop into both eyes 2 (two) times daily as needed (dry eyes). Over the counter lubricating eye drops   Yes Historical Provider, MD  ranitidine (ZANTAC) 75 MG tablet Take 75 mg by mouth 2 (two) times daily as needed for heartburn (stomach pain).   Yes Historical Provider, MD    Family History Family History  Problem Relation Age of Onset  . Hypertension Sister     Social  History Social History  Substance Use Topics  . Smoking status: Never Smoker  . Smokeless tobacco: Never Used  . Alcohol use Yes     Comment: occassionally     Allergies   Review of patient's allergies indicates no known allergies.   Review of Systems Review of Systems  Constitutional: Negative for fever.  Respiratory: Negative for shortness of breath.   Cardiovascular: Positive for chest pain.  Gastrointestinal: Positive for abdominal pain, nausea and vomiting. Negative for diarrhea.  Musculoskeletal: Positive for back pain.  All other systems reviewed and are negative.    Physical Exam Updated Vital Signs BP 116/75   Pulse 83   Temp 98.1 F (36.7 C) (Oral)   Resp 18   SpO2 99%   Physical Exam  Constitutional: She is oriented to person, place, and time. She appears well-developed and well-nourished. No distress.  HENT:  Head: Normocephalic and atraumatic.  Right Ear: External ear normal.  Left Ear: External ear normal.  Nose: Nose normal.  Eyes: Right eye exhibits no discharge. Left eye exhibits no discharge.  Cardiovascular: Normal rate, regular rhythm and normal heart sounds.   Pulmonary/Chest: Effort normal and breath sounds normal.  Abdominal: Soft. There is tenderness in the epigastric area and left upper quadrant.  Neurological: She is alert and oriented to person, place, and time.  Skin: Skin is warm and dry. She is not diaphoretic.  Nursing note and vitals reviewed.    ED Treatments / Results  Labs (all labs ordered are listed, but only abnormal results are displayed) Labs Reviewed  LIPASE, BLOOD - Abnormal; Notable for the following:       Result Value   Lipase 6,600 (*)    All other components within normal limits  COMPREHENSIVE METABOLIC PANEL - Abnormal; Notable for the following:    Glucose, Bld 158 (*)    AST 424 (*)    ALT 207 (*)    All other components within normal limits  CBC  URINALYSIS, ROUTINE W REFLEX MICROSCOPIC (NOT AT [facility])      EKG  EKG Interpretation  Date/Time:  Monday May 20 2016 20:18:54 EDT Ventricular Rate:  78 PR Interval:    QRS Duration: 98 QT Interval:  419 QTC Calculation: 478 R Axis:   49 Text Interpretation:  Sinus rhythm Low voltage, precordial leads Borderline T abnormalities, anterior leads no significant change since 2009 Confirmed by Duston Smolenski MD, Remell Giaimo [phone]([id number]) on 05/20/2016 8:52:43 PM       Radiology Ct Abdomen Pelvis W Contrast  Result Date: 05/20/2016 CLINICAL DATA:  55 year old female with epigastric pain, nausea vomiting. EXAM: CT ABDOMEN AND PELVIS WITH CONTRAST TECHNIQUE: Multidetector CT imaging of the abdomen and pelvis was performed using the standard protocol following bolus administration of intravenous contrast. CONTRAST:  100mL ISOVUE-300 IOPAMIDOL (ISOVUE-300) INJECTION 61% COMPARISON:  None. FINDINGS: There are minimal atelectatic changes of the lung bases. Small scattered nodular densities at the left lung base measuring up to 7 mm (series 201, image 8). Chest CT may provide complete evaluation of the lungs. No intra-abdominal free air. There is small inflammatory fluid in the upper abdomen. The liver appears unremarkable. There multiple small stones within the gallbladder. No pericholecystic fluid. There is no biliary ductal dilatation. No stone identified within the central CBD. First there is inflammatory changes of the pancreas predominantly involving the body and tail of the pancreas compatible with acute pancreatitis. There is no drainable fluid collection/ abscess or pseudocyst. The spleen, adrenal glands, kidneys, visualized ureters, and urinary bladder appear unremarkable. Hysterectomy. There is no evidence of bowel obstruction or active inflammation. Normal appendix. The abdominal aorta and IVC appear unremarkable. The origins of the celiac axis, SMA, IMA as well as the origins of the renal arteries are patent. The SMV, splenic vein, and main portal vein are patent. No  portal venous gas identified. There is no adenopathy. There is a small fat containing umbilical hernia. There is mild degenerative changes of the spine. Old-appearing left T12 transverse process fracture. No acute fracture. IMPRESSION: Acute pancreatitis. No abscess. Correlation with pancreatic enzymes recommended. Cholelithiasis. **An incidental finding of potential clinical significance has been found. Left lower lobe pulmonary nodules measuring up to 7 mm. Non-contrast chest CT at 3-6 months is recommended. If the nodules are stable at time of repeat CT, then future CT at 18-24 months (from today's scan) is considered optional for low-risk patients, but is recommended for high-risk patients. This recommendation follows the consensus statement: Guidelines for Management of Incidental Pulmonary Nodules Detected on CT Images:From the Fleischner Society 2017; published online before print (10.1148/[phone]).** Electronically Signed   By: Elgie CollardArash  Radparvar M.D.   On: 05/20/2016 22:06    Procedures Procedures (including critical care time)  Medications Ordered in ED Medications  HYDROmorphone (DILAUDID) injection 1 mg (1 mg Intravenous Given 05/20/16 1910)  ondansetron (ZOFRAN) injection 4 mg (  4 mg Intravenous Given 05/20/16 1910)  sodium chloride 0.9 % bolus 1,000 mL (0 mLs Intravenous Stopped 05/20/16 2223)  iopamidol (ISOVUE-300) 61 % injection (100 mLs  Contrast Given 05/20/16 2035)     Initial Impression / Assessment and Plan / ED Course  I have reviewed the triage vital signs and the nursing notes.  Pertinent labs & imaging results that were available during my care of the patient were reviewed by me and considered in my medical decision making (see chart for details).  Clinical Course    Patient presents with new onset pancreatitis. No obvious cause history and exam. CT shows pancreatitis but no other getting factors .Given fluids, will keep nothing by mouth, and admitted to family  practice for bowel rest.  Final Clinical Impressions(s) / ED Diagnoses   Final diagnoses:  Acute pancreatitis without infection or necrosis, unspecified pancreatitis type    New Prescriptions New Prescriptions   No medications on file     Pricilla Loveless, MD 05/20/16 2316

## 2016-05-20 NOTE — H&P (Signed)
Family Medicine [facility] Admission History and Physical Service Pager: [phone]  Patient name: [name] Medical record number: [id number] Date of birth: 12/21/1960 Age: 55 y.o. Gender: female  Primary Care Provider: Howard PouchLauren Feng, MD Consultants: none Code Status: FULL  Chief Complaint: abdominal pain, vomiting  Assessment and Plan: [name] is a 55 y.o. female presenting with abdominal pain and nausea and vomiting.   # Pancreatitis, Acute: Acute onset severe epigastric pain w/ associated N/V. No hematemesis/melena/hematochezia. Lipase 6,600. CT abdomen shows evidence suggesting cholelithiasis and pancreatitis. PMH is significant for GERD. No current daily medications. Last triglycerides were 189. No history of alcohol use. Etiology unknown at this time, considering biliary obstruction 2/2 cholelith as potential cause (no current obstruction noted on CT). Afebrile. Currently no acute abdomen.  - Admit to inpatient status; family medicine teaching service. - NPO - fluids at 33800mL/hr for next 6 hours, then reduce rate to 17850ml/hr - 0.5-1mg  dilaudid q2H for pain, avoid morphine usage as this may increase Sphincter of Oddi tone.  - Consider transition to PCA if pain control is inadequate with this regimen - zofran as needed for nausea - if has not resolved will consider GI consult for potential choledocholithiasis - close monitoring for evidence suggesting severe and systemic complications: SIRS/fever/acute abdomen/flank ecchymoses.  # Lung nodules: Small scattered nodular densities at the left lung base measuring up to 7 mm noted on CT, possibly an incidental finding. Patient has no smoking history, no reported known exposure to TB. - consider Chest CT for further evaluation  FEN/GI: NPO Prophylaxis: Lovenox   Disposition: admit to floor, discharge to home pending clinical improvement  History of Present Illness:  [name] is a 55 y.o. female  presenting with abdominal pain, nausea and vomiting. She speaks [place], daughter translates at bedside.  Abdominal pain started this afternoon at 12pm after eating a fatty meal. Patient points to epigastric area. Endorses radiation of pain to her back. Pain is constant in nature, described as burning in nature. Tried to eat a yogurt around 2pm after which pain increased significantly and she began vomiting. Emesis was described as the food she had eaten that day then was mucous and white in appearance. Denies blood in vomit. Reports a lot of gas. Tried to take Zantac earlier in the day to relieve pain but threw it up and felt no relief. Denies fevers, chills, hematochezia, melena, diarrhea, and dysuria. Felt well yesterday.   In ED, improved with 1 dose of zofran and NPO status. Pain still present after 1 mg dilaudid but much more tolerable. Understands the need for admission. Was able to answer patient's and family's questions to their satisfaction.    Review Of Systems: Per HPI Otherwise the remainder of the systems were negative.  Patient Active Problem List   Diagnosis Date Noted  . Acute pancreatitis 05/20/2016  . Abnormal cholesterol test 04/05/2016  . Prediabetes 04/05/2016    Past Medical History: History reviewed. No pertinent past medical history.  Past Surgical History: Past Surgical History:  Procedure Laterality Date  . EYE SURGERY      Social History: Social History  Substance Use Topics  . Smoking status: Never Smoker  . Smokeless tobacco: Never Used  . 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 No known TB in any family/friends.  Please also refer to relevant sections of  EMR.  Family History: Family History  Problem Relation Age of Onset  . Hypertension Sister     Allergies and Medications: No Known Allergies No  current facility-administered medications on file prior to encounter.    No current outpatient prescriptions on file prior to encounter.    Objective: BP 116/75   Pulse 83   Temp 98.1 F (36.7 C) (Oral)   Resp 18   SpO2 99%  Exam: General: well appearing female laying in bed in NAD HEENT: PERRLA, EOMI, sclera anicteric, MMM, neck FROM, no LAD Cardiovascular: RRR, no murmur or rub Respiratory: CTAB with no increased work of breathing Abdomen: soft, non-distended, no guarding, tender to palpation in RUQ/LUQ/RLQ but greatest at epigastrium, hyperactive bowel sounds Skin: warm, dry, no rashes; no evidence of Gray-Turner sign (flank ecchymosis) Extremities: no edema or cyanosis, 2+ dorsalis pedis pulses bilaterally Neuro: alert and oriented, no focal deficits  Labs and Imaging: CBC BMET   Recent Labs Lab 05/20/16 1702  WBC 9.2  HGB 13.3  HCT 39.9  PLT 232    Recent Labs Lab 05/20/16 1702  NA 139  K 3.9  CL 109  CO2 24  BUN 14  CREATININE 0.79  GLUCOSE 158*  CALCIUM 9.0     Lipase: 6,600 CT abdomen/pelvis: Acute pancreatitis. No abscess. Correlation with pancreatic enzymes recommended.  Tillman Sers, DO 05/20/2016, 11:12 PM PGY-1, [facility] FPTS Intern pager: [phone], text pages welcome    Upper Level Addendum:  I have seen and evaluated this patient along with Dr. Wonda Olds and reviewed the above note, making necessary revisions in red.   Kathee Delton, MD,MS,  PGY3 05/21/2016 12:54 AM

## 2016-05-20 NOTE — ED Notes (Signed)
Patient transported to CT 

## 2016-05-20 NOTE — ED Notes (Signed)
MD at bedside. 

## 2016-05-20 NOTE — ED Triage Notes (Signed)
Pt reports to the ED for eval of abd pain and N/V since 12 today. Pt has hx of gastritis and reports this feels similar. Denies any diarrhea or hematemesis. Pt denies any aggravating or relieving factors.

## 2016-05-20 NOTE — ED Notes (Signed)
Pt ambulated back to room from waiting room

## 2016-05-21 ENCOUNTER — Inpatient Hospital Stay (HOSPITAL_COMMUNITY): Payer: Self-pay

## 2016-05-21 DIAGNOSIS — K859 Acute pancreatitis without necrosis or infection, unspecified: Secondary | ICD-10-CM | POA: Diagnosis present

## 2016-05-21 DIAGNOSIS — R918 Other nonspecific abnormal finding of lung field: Secondary | ICD-10-CM

## 2016-05-21 LAB — COMPREHENSIVE METABOLIC PANEL
ALK PHOS: 85 U/L (ref 38–126)
ALT: 326 U/L — ABNORMAL HIGH (ref 14–54)
ANION GAP: 8 (ref 5–15)
AST: 419 U/L — ABNORMAL HIGH (ref 15–41)
Albumin: 3.9 g/dL (ref 3.5–5.0)
BUN: 10 mg/dL (ref 6–20)
CALCIUM: 8.8 mg/dL — AB (ref 8.9–10.3)
CO2: 22 mmol/L (ref 22–32)
Chloride: 109 mmol/L (ref 101–111)
Creatinine, Ser: 0.72 mg/dL (ref 0.44–1.00)
GFR calc non Af Amer: >60 mL/min (ref >60)
Glucose, Bld: 132 mg/dL — ABNORMAL HIGH (ref 65–99)
Potassium: 4.1 mmol/L (ref 3.5–5.1)
SODIUM: 139 mmol/L (ref 135–145)
TOTAL PROTEIN: 6.9 g/dL (ref 6.5–8.1)
Total Bilirubin: 0.6 mg/dL (ref 0.3–1.2)

## 2016-05-21 LAB — CBC
HCT: 38.6 % (ref 36.0–46.0)
HEMOGLOBIN: 12.7 g/dL (ref 12.0–15.0)
MCH: 29.1 pg (ref 26.0–34.0)
MCHC: 32.9 g/dL (ref 30.0–36.0)
MCV: 88.5 fL (ref 78.0–100.0)
Platelets: 229 10*3/uL (ref 150–400)
RBC: 4.36 MIL/uL (ref 3.87–5.11)
RDW: 13.2 % (ref 11.5–15.5)
WBC: 8.1 10*3/uL (ref 4.0–10.5)

## 2016-05-21 LAB — PHOSPHORUS: Phosphorus: 3.9 mg/dL (ref 2.5–4.6)

## 2016-05-21 LAB — MAGNESIUM: MAGNESIUM: 1.7 mg/dL (ref 1.7–2.4)

## 2016-05-21 LAB — TSH: TSH: 0.376 u[IU]/mL (ref 0.350–4.500)

## 2016-05-21 MED ORDER — SODIUM CHLORIDE 0.9 % IV SOLN
INTRAVENOUS | Status: DC
  Administered 2016-05-21 – 2016-05-22 (×4): via INTRAVENOUS

## 2016-05-21 MED ORDER — ENOXAPARIN SODIUM 40 MG/0.4ML ~~LOC~~ SOLN
40.0000 mg | SUBCUTANEOUS | Status: DC
  Administered 2016-05-21 – 2016-05-24 (×3): 40 mg via SUBCUTANEOUS
  Filled 2016-05-21 (×3): qty 0.4

## 2016-05-21 MED ORDER — HYDROMORPHONE HCL 1 MG/ML IJ SOLN
0.5000 mg | INTRAMUSCULAR | Status: DC | PRN
  Administered 2016-05-21: 0.5 mg via INTRAVENOUS
  Filled 2016-05-21: qty 1

## 2016-05-21 MED ORDER — SODIUM CHLORIDE 0.9 % IV SOLN
INTRAVENOUS | Status: AC
  Administered 2016-05-21: 01:00:00 via INTRAVENOUS

## 2016-05-21 NOTE — Progress Notes (Signed)
NURSING PROGRESS NOTE  Donna DominionJosefina QuezadaMRN: [id number] Admission Data: 05/21/2016 at 12AM Attending Provider: Uvaldo RisingKyle J Fletke, MD PCP: Howard PouchLauren Feng, MD Code status: Full  Allergies: No Known Allergies  Past Medical History: History reviewed. No pertinent past medical history.  Past Surgical History:  Past Surgical History:  Procedure Laterality Date  . EYE SURGERY     Donna PapasJosefina Mccann is a 55 y.o. female patient, arrived to floor in room [phone] via stretcher, transferred from ED. Patient alert and oriented X 4. No acute distress noted. Complains of pain 2/10 abdominal pain.  Vital signs: Oral temperature 98.3 F (36.8 C), Blood pressure 134/77, Pulse 70, RR 19, SpO2 98 % on room air. Weight 146 lbs (66.6 kg).   IV access: Left AC-IV saline locked; condition patent and no redness.  Skin: intact, no pressure ulcer noted in sacral area. Skin assessment completed by Hale DroneKristen C, RN and Avie ArenasNikki W, RN.  Patient's ID armband verified with patient/ family, and in place. Information packet given to patient/ family. Fall risk assessed, SR up X2, patient/ family able to verbalize understanding of risks associated with falls and to call nurse or staff to assist before getting out of bed. Patient/ family oriented to room and equipment. Call bell within reach.

## 2016-05-21 NOTE — Progress Notes (Addendum)
Family Medicine Teaching Service Daily Progress Note Intern Pager: [phone]  Patient name: [name] Medical record number: [id number] Date of birth: 01/19/1961 Age: 55 y.o. Gender: female  Primary Care Provider: Howard PouchLauren Feng, MD Consultants: none Code Status: FULL  Pt Overview and Major Events to Date:  55 yo female presented with abdominal pain, nausea, vomiting. Treating for pancreatitis. Symptoms resolving.  Assessment and Plan:  [name] is a 55 y.o. female presenting with abdominal pain and nausea and vomiting.   # Pancreatitis, Acute: Acute onset severe epigastric pain w/ N/V. No hematemesis/melena/hematochezia. Lipase 6,600. CT abdomen shows evidence suggesting cholelithiasis and pancreatitis. PMH is significant for GERD. No current daily medications. Last triglycerides were 189. No history of alcohol use. Biliary obstruction 2/2 cholelith possible etiology. Afebrile. No acute abdomen. - start clear liquids diet, will gradually advance - fluids at 17250ml/hr - 0.5-1mg  dilaudid q2H for pain, avoid morphine usage as this may increase Sphincter of Oddi tone. - zofran as needed for nausea -close monitoring for evidence suggesting severe and systemic complications: SIRS/fever/acute abdomen/flank ecchymoses   # Lung nodules: Small scattered nodular densities at the left lung base measuring up to 7 mm noted on CT, possibly an incidental finding. Patient has no smoking history, no reported known exposure to TB.  -Order chest CT for further evaluation -Per radiology- Non-contrast chest CT at 3-6 months is recommended. If the nodules are stable at time of repeat CT, then future CT at 18-24 months (from this scan 05/20/16) is considered optional for low-risk patients, but is recommended for high-risk patients.  FEN/GI: Clear liquids Prophylaxis: Lovenox   Disposition: admit to floor, discharge to home pending clinical improvement  Subjective:  [name] reports feeling  much better today. Abdominal pain is 1/10 and she does not feel she needs any pain meds. Denies nausea, vomiting, SOB, night sweats.  Objective: Temp:  [98.1 F (36.7 C)-98.4 F (36.9 C)] 98.4 F (36.9 C) (09/05 0622) Pulse Rate:  [55-83] 63 (09/05 0622) Resp:  [14-24] 19 (09/05 0622) BP: (114-148)/(63-95) 118/67 (09/05 0622) SpO2:  [95 %-100 %] 98 % (09/05 0622) Weight:  [66.6 kg (146 lb 13.2 oz)] 66.6 kg (146 lb 13.2 oz) (09/05 0021)  Physical Exam: General: well appearing female laying in bed in NAD HEENT: EOMI, sclera anicteric Cardiovascular: RRR, no murmur Respiratory: CTAB with no increased work of breathing Abdomen: soft, non-distended, no guarding, tender to palpation LUQ/LLQ, bowel sounds in all quadrants Skin: warm, dry, no rashes; no evidence of Gray-turner sign Extremities: no edema or cyanosis Neuro: alert, oriented, no focal deficits  Laboratory:  Recent Labs Lab 05/20/16 1702 05/21/16 0048  WBC 9.2 8.1  HGB 13.3 12.7  HCT 39.9 38.6  PLT 232 229    Recent Labs Lab 05/20/16 1702 05/21/16 0048  NA 139 139  K 3.9 4.1  CL 109 109  CO2 24 22  BUN 14 10  CREATININE 0.79 0.72  CALCIUM 9.0 8.8*  PROT 6.9 6.9  BILITOT 1.2 0.6  ALKPHOS 89 85  ALT 207* 326*  AST 424* 419*  GLUCOSE 158* 132*    Imaging/Diagnostic Tests: CT abd/pelvis w contrast IMPRESSION: Acute pancreatitis. No abscess. Correlation with pancreatic enzymes recommended. Cholelithiasis. Incidental finding left lower lobe pulmonary nodules measuring up to 7mm  Ernestina ColumbiaPaige Schmidt, Medical Student 05/21/2016, 7:12 AM [facility] FPTS Intern pager: [phone], text pages welcome    RESIDENT ADDENDUM  I have separately seen and examined the patient. I have discussed the findings and exam with  the medical student and agree with the above note, which I have edited appropriately. I helped develop the management plan that is described in the student's note, and I agree with the  content.  Additionally I have outlined my exam and assessment/plan below:   PE:  General: well appearing female laying in bed in NAD Cardiovascular: RRR, no murmur Respiratory: CTAB with no increased work of breathing Abdomen: +BS, soft, non-distended, no guarding or rebound, tender to palpation in epigastric region Skin: warm, dry, no evidence of flank ecchymosis  Neuro: alert, oriented, no focal deficits  A/P:  Acute Pancreatitis: Lipase 6600 at admission. Unclear etiology but considering biliary obstruction (no obstruction seen on CT, so possibly resolved at time of imaging) 2/2 to cholelithiasis. Triglycerides recently checked and not significantly elevated. Patient denies EtOH use. Today, pain improved and patient without N/V. Only required one dose of 0.5 mg Dilaudid overnight. AST and ALT still elevated to 419 and 326, respectively.  -d/c NPO status and advance to clear liquid diet  -continue NS at 150 cc/hr, will reduce IVFs as patient increases PO intake   -Dilaudid 0.5-1 mg q2h prn for pain -zofran prn for nausea  -f/u pain with advancement of diet  -RUQ [place] ordered     Lung Nodules: Small scattered nodular densities at the left lung base measuring up to 7 mm noted on CT, possibly an incidental finding. Patient has no smoking history, no reported known exposure to TB. -CT chest today for further evaluation  -will likely need repeat follow up imaging as outpatient per radiology recommendations     Marcy Siren, D.O. 05/21/2016, 12:07 PM PGY-2,  Family Medicine

## 2016-05-22 DIAGNOSIS — K8020 Calculus of gallbladder without cholecystitis without obstruction: Secondary | ICD-10-CM

## 2016-05-22 DIAGNOSIS — K810 Acute cholecystitis: Secondary | ICD-10-CM

## 2016-05-22 LAB — COMPREHENSIVE METABOLIC PANEL
ALT: 166 U/L — ABNORMAL HIGH (ref 14–54)
AST: 104 U/L — AB (ref 15–41)
Albumin: 3.2 g/dL — ABNORMAL LOW (ref 3.5–5.0)
Alkaline Phosphatase: 67 U/L (ref 38–126)
Anion gap: 12 (ref 5–15)
CHLORIDE: 105 mmol/L (ref 101–111)
CO2: 23 mmol/L (ref 22–32)
Calcium: 7.9 mg/dL — ABNORMAL LOW (ref 8.9–10.3)
Creatinine, Ser: 0.51 mg/dL (ref 0.44–1.00)
Glucose, Bld: 95 mg/dL (ref 65–99)
POTASSIUM: 3.4 mmol/L — AB (ref 3.5–5.1)
SODIUM: 140 mmol/L (ref 135–145)
Total Bilirubin: 0.7 mg/dL (ref 0.3–1.2)
Total Protein: 5.9 g/dL — ABNORMAL LOW (ref 6.5–8.1)

## 2016-05-22 LAB — LIPASE, BLOOD: LIPASE: 73 U/L — AB (ref 11–51)

## 2016-05-22 MED ORDER — SODIUM CHLORIDE 0.9 % IV SOLN
INTRAVENOUS | Status: DC
  Administered 2016-05-22 (×2): via INTRAVENOUS
  Administered 2016-05-23: 1000 mL via INTRAVENOUS

## 2016-05-22 MED ORDER — DEXTROSE 5 % IV SOLN
2.0000 g | INTRAVENOUS | Status: DC
Start: 2016-05-22 — End: 2016-05-24
  Administered 2016-05-22 – 2016-05-23 (×2): 2 g via INTRAVENOUS
  Filled 2016-05-22 (×3): qty 2

## 2016-05-22 NOTE — Progress Notes (Signed)
Family Medicine Teaching Service Daily Progress Note Intern Pager: [phone]  Patient name: [name] Medical record number: [id number] Date of birth: Nov 01, 1960 Age: 55 y.o. Gender: female  Primary Care Provider: Howard Pouch, MD Consultants: none Code Status: FULL  Pt Overview and Major Events to Date:  55 yo female presented with abdominal pain, nausea, vomiting. Treating for pancreatitis. Symptoms resolving.  Assessment and Plan:  [name] is a 55 y.o. female presenting with abdominal pain and nausea and vomiting.   # Pancreatitis, Acute: Acute onset severe epigastric pain w/ N/V. Lipase 6,600. CT abdomen shows evidence suggesting cholelithiasis and pancreatitis. U/S showed gallbladder full of stones, no ductal dilatation. Last triglycerides were 189. No history of alcohol use. Biliary obstruction 2/2 cholelith possible etiology. - tolerating clear liquids, continue to advance diet as tolerated -d/c IVF - 0.5-1mg  dilaudid q2H for pain, avoid morphine usage as this may increase Sphincter of Oddi tone. - zofran as needed for nausea -consult surgery  # Elevated liver enzymes -trending down, today AST 104, ALT 166 -continue to follow on CMP daily  # Lung nodules: Small scattered nodular densities at the left lung base measuring up to 7 mm noted on CT, possibly an incidental finding. Patient has no smoking history, no reported known exposure to TB.  -Per radiology- Non-contrast chest CT at 6-12 months is recommended. If the nodule is stable at time of repeat CT, then future CT at 18-24 months (from 05/21/16 scan) is considered optional for low-risk patients, but is recommended for high-risk patients  FEN/GI: Soft diet Prophylaxis: Lovenox   Disposition: admit to floor, discharge to home pending clinical improvement  Subjective:  [name] feels well today. Denies nausea, vomiting, abdominal pain. Tolerated clear liquids well last night. Patient plans to eat soft  foods for breakfast this morning.  Objective: Temp:  [98.2 F (36.8 C)-98.4 F (36.9 C)] 98.4 F (36.9 C) (09/06 0511) Pulse Rate:  [62-63] 63 (09/06 0511) Resp:  [16-18] 18 (09/06 0511) BP: (125-140)/(73-75) 125/75 (09/06 0511) SpO2:  [99 %] 99 % (09/06 0511)  Physical Exam: General: well appearing female laying in bed in NAD HEENT: EOMI, sclera anicteric Cardiovascular: RRR, no murmur Respiratory: CTAB with no increased work of breathing Abdomen: soft, non-distended, no guarding, no tenderness to palpation, bowel sounds in all quadrants Skin: warm, dry, no rashes; no evidence of Gray-turner sign Extremities: no edema or cyanosis Neuro: alert, oriented, no focal deficits  Laboratory:  Recent Labs Lab 05/20/16 1702 05/21/16 0048  WBC 9.2 8.1  HGB 13.3 12.7  HCT 39.9 38.6  PLT 232 229    Recent Labs Lab 05/20/16 1702 05/21/16 0048 05/22/16 0548  NA 139 139 140  K 3.9 4.1 3.4*  CL 109 109 105  CO2 24 22 23   BUN 14 10 <5*  CREATININE 0.79 0.72 0.51  CALCIUM 9.0 8.8* 7.9*  PROT 6.9 6.9 5.9*  BILITOT 1.2 0.6 0.7  ALKPHOS 89 85 67  ALT 207* 326* 166*  AST 424* 419* 104*  GLUCOSE 158* 132* 95    Imaging/Diagnostic Tests: RUQ [place] 05/21/16 Impression: The gallbladder is full of shadowing stones. Mild gallbladder wall thickening. No pericholecystic fluid or ductal dilatation. No Murphy sign.  [name], Medical Student 05/22/2016, 2:17 PM [facility] FPTS Intern pager: [phone], text pages welcome    RESIDENT ADDENDUM  I have separately seen and examined the patient. I have discussed the findings and exam with the medical student and agree with the above note, which I  have edited appropriately. I helped develop the management plan that is described in the student's note, and I agree with the content.  Additionally I have outlined my exam and assessment/plan below:   PE:  General: well appearing female walking around hospital room   Cardiovascular: RRR, no murmur Respiratory: CTAB with no increased work of breathing Abdomen: +BS, soft, no TTP, non-distended, no guarding or rebound,  Neuro: alert, oriented, no focal deficits  A/P:  Acute Pancreatitis: Lipase 6600 at admission. Unclear etiology but considering biliary obstruction (no obstruction seen on CT, so possibly resolved at time of imaging) 2/2 to cholelithiasis. RUQ Ultrasound obtained and significant for gallbladder full of shadowing stools. There was mild gallbladder wall thickening but no common bile duct dilation or pericholecystic fluid. AST and ALT downtrending but still elevated. Pain resolved and patient tolerating clear liquid diet.  -diet advanced to softs; advance further as tolerated  -d/c IVF and encourage PO intake  -d/c Dilaudid for pain as patient has not required pain medication for >24 hours  -zofran prn for nausea  -General Surgery consulted for evaluation for possible cholecystectomy inpatient vs. Outpatient, appreciate recs    Lung Nodules: Small scattered nodular densities at the left lung base measuring up to 7 mm noted on CT, possibly an incidental finding. CT chest obtained to further evaluate. CT significant for bilateral lower lobe pulmonary nodules (largest 7 mm).  -per radiology recommendations, patient should have non-contrast CT at 6-12 months; will put these recommendations in the discharge summary for PCP to f/u   [name], D.O. 05/22/2016, 3:52 PM PGY-2, [facility]

## 2016-05-22 NOTE — Progress Notes (Signed)
Patient requested that her diet be advanced. I changed the diet order to soft per doctor's order to advance as tolerated. After clear liquid breakfast, patient started having pain again so I changed her diet back to clears. [name], [name] D

## 2016-05-22 NOTE — Consult Note (Signed)
[facility] Surgery Consult Note  [name] January 12, 1961  [id number].    Requesting MD: Georges Lynch Chief Complaint/Reason for Consult: Gallstone pancreatitis  HPI:  [name] is a 55yo Spanish speaking female who presented to [facility] 05/20/16 with acute onset abdominal pain with associated nausea and vomiting. The pain was epigastric and RUQ. She had never had this type of pain before. In the ED her LFT's were elevated (AST 424, ALT 207), lipase was 6600, and CT scan showed acute pancreatitis as well as cholelithiasis. Patient was admitted for pain control, IVF, and antiemetics. States that her pain has started to improved. Still located epigastric and RUQ, but less severe than 2 days ago. It is worse with ambulation. Her nausea and vomiting has resolved. Last BM was 05/20/16. She is now tolerating a soft diet, currently eating lunch. LFT's have trended down (AST 104, ALT 166). No recent lipase level.  No significant PMH Nonsmoker States that she does not drink alcohol Past abdominal surgical history includes hysterectomy ~2007 Has 4 children, all delivered vaginally Works in house keeping  ROS: All systems reviewed and otherwise negative except for as above  Family History  Problem Relation Age of Onset  . Hypertension Sister     History reviewed. No pertinent past medical history.  Past Surgical History:  Procedure Laterality Date  . EYE SURGERY      Social History:  reports that she has never smoked. She has never used smokeless tobacco. She reports that she drinks alcohol. She reports that she does not use drugs.  Allergies: No Known Allergies  Medications Prior to Admission  Medication Sig Dispense Refill  . Calcium Carbonate-Vitamin D (CALTRATE 600+D PO) Take 1 tablet by mouth at bedtime.    . Cyanocobalamin (VITAMIN B-12 PO) Take 1 tablet by mouth at bedtime.    . Omega-3 Fatty Acids (OMEGA 3 PO) Take 1 capsule by mouth at bedtime.    Marland Kitchen OVER THE COUNTER  MEDICATION Place 1 drop into both eyes 2 (two) times daily as needed (dry eyes). Over the counter lubricating eye drops    . ranitidine (ZANTAC) 75 MG tablet Take 75 mg by mouth 2 (two) times daily as needed for heartburn (stomach pain).      Blood pressure 125/75, pulse 63, temperature 98.4 F (36.9 C), temperature source Oral, resp. rate 18, height '5\' 2"'  (1.575 m), weight 146 lb 13.2 oz (66.6 kg), SpO2 99 %. Physical Exam: General: pleasant, WD/WN female who is sitting in bed in NAD HEENT: head is normocephalic, atraumatic.  lesions.  Mouth is pink and moist Heart: regular, rate, and rhythm.  No obvious murmurs, gallops, or rubs noted.  Palpable pedal pulses bilaterally Lungs: CTAB, no wheezes, rhonchi, or rales noted.  Respiratory effort nonlabored Abd: soft, ND, +BS, no masses, hernias, or organomegaly. Mildly tender RUQ and epigastrium MS: all 4 extremities are symmetrical with no cyanosis, clubbing, or edema. Skin: warm and dry with no masses, lesions, or rashes Psych: A&Ox3 with an appropriate affect.  Results for orders placed or performed during the hospital encounter of 05/20/16 (from the past 48 hour(s))  Lipase, blood     Status: Abnormal   Collection Time: 05/20/16  5:02 PM  Result Value Ref Range   Lipase 6,600 (H) 11 - 51 U/L    Comment: RESULTS CONFIRMED BY MANUAL DILUTION  Comprehensive metabolic panel     Status: Abnormal   Collection Time: 05/20/16  5:02 PM  Result Value Ref Range   Sodium 139  135 - 145 mmol/L   Potassium 3.9 3.5 - 5.1 mmol/L   Chloride 109 101 - 111 mmol/L   CO2 24 22 - 32 mmol/L   Glucose, Bld 158 (H) 65 - 99 mg/dL   BUN 14 6 - 20 mg/dL   Creatinine, Ser 0.79 0.44 - 1.00 mg/dL   Calcium 9.0 8.9 - 10.3 mg/dL   Total Protein 6.9 6.5 - 8.1 g/dL   Albumin 3.9 3.5 - 5.0 g/dL   AST 424 (H) 15 - 41 U/L   ALT 207 (H) 14 - 54 U/L   Alkaline Phosphatase 89 38 - 126 U/L   Total Bilirubin 1.2 0.3 - 1.2 mg/dL   GFR calc non Af Amer >60 >60 mL/min   GFR  calc Af Amer >60 >60 mL/min    Comment: (NOTE) The eGFR has been calculated using the CKD EPI equation. This calculation has not been validated in all clinical situations. eGFR's persistently <60 mL/min signify possible Chronic Kidney Disease.    Anion gap 6 5 - 15  CBC     Status: None   Collection Time: 05/20/16  5:02 PM  Result Value Ref Range   WBC 9.2 4.0 - 10.5 K/uL   RBC 4.49 3.87 - 5.11 MIL/uL   Hemoglobin 13.3 12.0 - 15.0 g/dL   HCT 39.9 36.0 - 46.0 %   MCV 88.9 78.0 - 100.0 fL   MCH 29.6 26.0 - 34.0 pg   MCHC 33.3 30.0 - 36.0 g/dL   RDW 13.2 11.5 - 15.5 %   Platelets 232 150 - 400 K/uL  Urinalysis, Routine w reflex microscopic     Status: None   Collection Time: 05/20/16  6:25 PM  Result Value Ref Range   Color, Urine YELLOW YELLOW   APPearance CLEAR CLEAR   Specific Gravity, Urine 1.019 1.005 - 1.030   pH 5.0 5.0 - 8.0   Glucose, UA NEGATIVE NEGATIVE mg/dL   Hgb urine dipstick NEGATIVE NEGATIVE   Bilirubin Urine NEGATIVE NEGATIVE   Ketones, ur NEGATIVE NEGATIVE mg/dL   Protein, ur NEGATIVE NEGATIVE mg/dL   Nitrite NEGATIVE NEGATIVE   Leukocytes, UA NEGATIVE NEGATIVE    Comment: MICROSCOPIC NOT DONE ON URINES WITH NEGATIVE PROTEIN, BLOOD, LEUKOCYTES, NITRITE, OR GLUCOSE <1000 mg/dL.  Magnesium     Status: None   Collection Time: 05/21/16 12:48 AM  Result Value Ref Range   Magnesium 1.7 1.7 - 2.4 mg/dL  Phosphorus     Status: None   Collection Time: 05/21/16 12:48 AM  Result Value Ref Range   Phosphorus 3.9 2.5 - 4.6 mg/dL  Comprehensive metabolic panel     Status: Abnormal   Collection Time: 05/21/16 12:48 AM  Result Value Ref Range   Sodium 139 135 - 145 mmol/L   Potassium 4.1 3.5 - 5.1 mmol/L   Chloride 109 101 - 111 mmol/L   CO2 22 22 - 32 mmol/L   Glucose, Bld 132 (H) 65 - 99 mg/dL   BUN 10 6 - 20 mg/dL   Creatinine, Ser 0.72 0.44 - 1.00 mg/dL   Calcium 8.8 (L) 8.9 - 10.3 mg/dL   Total Protein 6.9 6.5 - 8.1 g/dL   Albumin 3.9 3.5 - 5.0 g/dL   AST  419 (H) 15 - 41 U/L   ALT 326 (H) 14 - 54 U/L   Alkaline Phosphatase 85 38 - 126 U/L   Total Bilirubin 0.6 0.3 - 1.2 mg/dL   GFR calc non Af Amer >60 >60 mL/min  GFR calc Af Amer >60 >60 mL/min    Comment: (NOTE) The eGFR has been calculated using the CKD EPI equation. This calculation has not been validated in all clinical situations. eGFR's persistently <60 mL/min signify possible Chronic Kidney Disease.    Anion gap 8 5 - 15  CBC     Status: None   Collection Time: 05/21/16 12:48 AM  Result Value Ref Range   WBC 8.1 4.0 - 10.5 K/uL   RBC 4.36 3.87 - 5.11 MIL/uL   Hemoglobin 12.7 12.0 - 15.0 g/dL   HCT 38.6 36.0 - 46.0 %   MCV 88.5 78.0 - 100.0 fL   MCH 29.1 26.0 - 34.0 pg   MCHC 32.9 30.0 - 36.0 g/dL   RDW 13.2 11.5 - 15.5 %   Platelets 229 150 - 400 K/uL  TSH     Status: None   Collection Time: 05/21/16  1:25 AM  Result Value Ref Range   TSH 0.376 0.350 - 4.500 uIU/mL  Comprehensive metabolic panel Once     Status: Abnormal   Collection Time: 05/22/16  5:48 AM  Result Value Ref Range   Sodium 140 135 - 145 mmol/L   Potassium 3.4 (L) 3.5 - 5.1 mmol/L   Chloride 105 101 - 111 mmol/L   CO2 23 22 - 32 mmol/L   Glucose, Bld 95 65 - 99 mg/dL   BUN <5 (L) 6 - 20 mg/dL   Creatinine, Ser 0.51 0.44 - 1.00 mg/dL   Calcium 7.9 (L) 8.9 - 10.3 mg/dL   Total Protein 5.9 (L) 6.5 - 8.1 g/dL   Albumin 3.2 (L) 3.5 - 5.0 g/dL   AST 104 (H) 15 - 41 U/L   ALT 166 (H) 14 - 54 U/L   Alkaline Phosphatase 67 38 - 126 U/L   Total Bilirubin 0.7 0.3 - 1.2 mg/dL   GFR calc non Af Amer >60 >60 mL/min   GFR calc Af Amer >60 >60 mL/min    Comment: (NOTE) The eGFR has been calculated using the CKD EPI equation. This calculation has not been validated in all clinical situations. eGFR's persistently <60 mL/min signify possible Chronic Kidney Disease.    Anion gap 12 5 - 15   Ct Chest Wo Contrast  Result Date: 05/21/2016 CLINICAL DATA:  Follow-up pulmonary nodule EXAM: CT CHEST WITHOUT  CONTRAST TECHNIQUE: Multidetector CT imaging of the chest was performed following the standard protocol without IV contrast. COMPARISON:  CT abdomen yesterday FINDINGS: Cardiovascular: No evidence of aortic aneurysm or intramural hematoma. Mediastinum/Nodes: Small scattered mediastinal nodes. Lungs/Pleura: No pneumothorax or pleural effusion. 3 mm right middle lobe nodule on image 61. 5 mm left lower lobe pulmonary nodule on image 86. 7 mm sub solid nodule in the left lower lobe on image 78. Upper Abdomen: Stable inflammatory changes about the pancreas. The gallbladder contains soft tissue density with peripheral calcification. This may represent a large gallstone or possibly gallbladder wall calcification. It is stable in appearance. Musculoskeletal: No vertebral compression deformity. IMPRESSION: Bilateral lower lobe pulmonary nodules. The largest is 7 mm. Non-contrast chest CT at 6-12 months is recommended. If the nodule is stable at time of repeat CT, then future CT at 18-24 months (from today's scan) is considered optional for low-risk patients, but is recommended for high-risk patients. This recommendation follows the consensus statement: Guidelines for Management of Incidental Pulmonary Nodules Detected on CT Images:From the Fleischner Society 2017; published online before print (10.1148/radiol.[id number]). Stable appearance of the abdomen. Electronically Signed  By: Marybelle Killings M.D.   On: 05/21/2016 12:34   Ct Abdomen Pelvis W Contrast  Result Date: 05/20/2016 CLINICAL DATA:  55 year old female with epigastric pain, nausea vomiting. EXAM: CT ABDOMEN AND PELVIS WITH CONTRAST TECHNIQUE: Multidetector CT imaging of the abdomen and pelvis was performed using the standard protocol following bolus administration of intravenous contrast. CONTRAST:  147m ISOVUE-300 IOPAMIDOL (ISOVUE-300) INJECTION 61% COMPARISON:  None. FINDINGS: There are minimal atelectatic changes of the lung bases. Small scattered nodular  densities at the left lung base measuring up to 7 mm (series 201, image 8). Chest CT may provide complete evaluation of the lungs. No intra-abdominal free air. There is small inflammatory fluid in the upper abdomen. The liver appears unremarkable. There multiple small stones within the gallbladder. No pericholecystic fluid. There is no biliary ductal dilatation. No stone identified within the central CBD. First there is inflammatory changes of the pancreas predominantly involving the body and tail of the pancreas compatible with acute pancreatitis. There is no drainable fluid collection/ abscess or pseudocyst. The spleen, adrenal glands, kidneys, visualized ureters, and urinary bladder appear unremarkable. Hysterectomy. There is no evidence of bowel obstruction or active inflammation. Normal appendix. The abdominal aorta and IVC appear unremarkable. The origins of the celiac axis, SMA, IMA as well as the origins of the renal arteries are patent. The SMV, splenic vein, and main portal vein are patent. No portal venous gas identified. There is no adenopathy. There is a small fat containing umbilical hernia. There is mild degenerative changes of the spine. Old-appearing left T12 transverse process fracture. No acute fracture. IMPRESSION: Acute pancreatitis. No abscess. Correlation with pancreatic enzymes recommended. Cholelithiasis. **An incidental finding of potential clinical significance has been found. Left lower lobe pulmonary nodules measuring up to 7 mm. Non-contrast chest CT at 3-6 months is recommended. If the nodules are stable at time of repeat CT, then future CT at 18-24 months (from today's scan) is considered optional for low-risk patients, but is recommended for high-risk patients. This recommendation follows the consensus statement: Guidelines for Management of Incidental Pulmonary Nodules Detected on CT Images:From the Fleischner Society 2017; published online before print  (10.1148/[id number].** Electronically Signed   By: AAnner CreteM.D.   On: 05/20/2016 22:06   [place]dom[place] Limited Ruq  Result Date: 05/21/2016 CLINICAL DATA:  Acute pancreatitis. EXAM: [place]DOMEN LIMITED - RIGHT UPPER QUADRANT COMPARISON:  CT 05/20/2016 FINDINGS: Gallbladder: The gallbladder is full of shadowing stones. Gallbladder wall thickness is 4 mm. No Murphy sign. Common bile duct: Diameter: Normal at 2 mm. Liver: Echogenicity normal.  No focal lesion. IMPRESSION: The gallbladder is full of shadowing stones. Mild gallbladder wall thickening. No pericholecystic fluid or ductal dilatation. No Murphy sign. Electronically Signed   By: MNelson ChimesM.D.   On: 05/21/2016 20:55      Assessment/Plan Gallstone pancreatitis - RUQ u/s shows cholelithiasis with mild gallbladder wall thickening - abdominal pain improving - LFT's have trended down but are still elevated (AST 104, ALT 166), normal bilirubin  - recheck lipase  FEN - soft diet, NPO after midnight VTE - lovenox, SCD's  Plan - clinically patient's abdominal pain is improving. We will recheck her lipase level. Depending on lab result will consider cholecystectomy tomorrow.  BJerrye Beavers PP[facility] 05/22/2016, 1:18 PM Pager: [phone]: [phone]-Fri 7:00 am-4:30 pm Sat-Sun 7:00 am-11:30 am

## 2016-05-22 NOTE — Discharge Summary (Signed)
Family Medicine Teaching [facility] Discharge Summary  Patient name: [name] Medical record number: [id number] Date of birth: 10-Sep-1961 Age: 55 y.o. Gender: female Date of Admission: 05/20/2016  Date of Discharge: 05/22/16 Admitting Physician: Uvaldo Rising, MD  Primary Care Provider: Howard Pouch, MD Consultants: None  Indication for Hospitalization: Pancreatitis  Discharge Diagnoses/Problem List:  1. Pancreatitis 2. Lung nodules, incidental finding 3. S/p Cholecystectomy  Disposition: Discharge home  Discharge Condition: stable  Discharge Exam:  General: well appearing female laying in bed in NAD HEENT: EOMI, sclera anicteric Cardiovascular: RRR, no murmur Respiratory: CTAB with no increased work of breathing Abdomen: soft, non-distended, no guarding, 4 surgical incision sites with overlying bandages that are clean, dry and intact. Appropriately tender to palpation. Skin: warm, dry, no rashes; no evidence of Gray-turner sign Extremities: no edema or cyanosis Neuro: alert, oriented, no focal deficits   Brief Hospital Course:  Patient presented to ED 05/20/2016 with one day history of severe epigastric abdominal pain, nausea, vomiting. CT showed evidence suggesting pancreatitis and cholelithiasis. Lipase was elevated to >6000.  U/S RUQ showed gallbladder full of stones with mild gallbladder wall thickening. There was no pericholecystic fluid or ductal dilation. Gallstone pancreatitis is most likely etiology. Patient denied EtOH use and triglycerides were not significantly elevated on recent lipid panel in July 2017.   Pancreatitis was managed with fluids, zofran, and pain medications. Patient clinically improved and tolerated advancement of diet. Gen Surgery was consulted given concern of future complication of cholelithiasis. Laparoscopic cholecystectomy was performed 05/23/16. Patient tolerated procedure well. Had good pain control and was tolerating PO intake at time of  discharge.   Issues for Follow Up:  1. Pulmonary nodules:  Incidental finding of pulmonary nodules on CT abdomen which was followed up with CT chest for further evaluation. "Non-contrast chest CT at 6-12 months is recommended. If the nodule is stable at time of repeat CT, then future CT at 18-24 months (from today's scan) is considered optional for low-risk patients, but is recommended for high-risk patients. This recommendation follows the consensus statement: Guidelines for Management of Incidental Pulmonary Nodules Detected on CT Images:From the Fleischner Society 2017; published online before print (10.1148/radiol.[id number])."  Significant Procedures:   05/23/2016: Laparoscopic cholecystectomy with intraoperative cholangiogram, no complications Findings: Cholecystitis with Cholelithiasis  Wilmon Arms. Corliss Skains, MD, [facility] Surgery  General/ Trauma Surgery  Significant Labs and Imaging:   Recent Labs Lab 05/20/16 1702 05/21/16 0048 05/23/16 1307  WBC 9.2 8.1 8.7  HGB 13.3 12.7 13.0  HCT 39.9 38.6 38.6  PLT 232 229 227    Recent Labs Lab 05/20/16 1702 05/21/16 0048 05/22/16 0548 05/23/16 0334 05/24/16 0549  NA 139 139 140 140 142  K 3.9 4.1 3.4* 3.5 4.1  CL 109 109 105 111 106  CO2 24 22 23  20* 26  GLUCOSE 158* 132* 95 96 130*  BUN 14 10 <5* <5* <5*  CREATININE 0.79 0.72 0.51 0.46 0.55  CALCIUM 9.0 8.8* 7.9* 8.3* 8.6*  MG  --  1.7  --   --   --   PHOS  --  3.9  --   --   --   ALKPHOS 89 85 67 67 66  AST 424* 419* 104* 64* 82*  ALT 207* 326* 166* 122* 128*  ALBUMIN 3.9 3.9 3.2* 3.3* 3.1*   Lipase 6600  Dg Cholangiogram Operative  Result Date: 05/23/2016 CLINICAL DATA:  Pancreatitis EXAM: INTRAOPERATIVE CHOLANGIOGRAM TECHNIQUE: Cholangiographic images from the C-arm fluoroscopic device were submitted for  interpretation post-operatively. Please see the procedural report for the amount of contrast and the fluoroscopy time utilized. COMPARISON:  None.  FINDINGS: Contrast fills the biliary tree and duodenum without filling defects in the common bile duct. IMPRESSION: Patent biliary tree. Electronically Signed   By: Jolaine Click M.D.   On: 05/23/2016 09:41   Ct Chest Wo Contrast  Result Date: 05/21/2016 CLINICAL DATA:  Follow-up pulmonary nodule EXAM: CT CHEST WITHOUT CONTRAST TECHNIQUE: Multidetector CT imaging of the chest was performed following the standard protocol without IV contrast. COMPARISON:  CT abdomen yesterday FINDINGS: Cardiovascular: No evidence of aortic aneurysm or intramural hematoma. Mediastinum/Nodes: Small scattered mediastinal nodes. Lungs/Pleura: No pneumothorax or pleural effusion. 3 mm right middle lobe nodule on image 61. 5 mm left lower lobe pulmonary nodule on image 86. 7 mm sub solid nodule in the left lower lobe on image 78. Upper Abdomen: Stable inflammatory changes about the pancreas. The gallbladder contains soft tissue density with peripheral calcification. This may represent a large gallstone or possibly gallbladder wall calcification. It is stable in appearance. Musculoskeletal: No vertebral compression deformity. IMPRESSION: Bilateral lower lobe pulmonary nodules. The largest is 7 mm. Non-contrast chest CT at 6-12 months is recommended. If the nodule is stable at time of repeat CT, then future CT at 18-24 months (from today's scan) is considered optional for low-risk patients, but is recommended for high-risk patients. This recommendation follows the consensus statement: Guidelines for Management of Incidental Pulmonary Nodules Detected on CT Images:From the Fleischner Society 2017; published online before print (10.1148/radiol.[id number]). Stable appearance of the abdomen. Electronically Signed   By: Jolaine Click M.D.   On: 05/21/2016 12:34   Ct Abdomen Pelvis W Contrast  Result Date: 05/20/2016 CLINICAL DATA:  55 year old female with epigastric pain, nausea vomiting. EXAM: CT ABDOMEN AND PELVIS WITH CONTRAST TECHNIQUE:  Multidetector CT imaging of the abdomen and pelvis was performed using the standard protocol following bolus administration of intravenous contrast. CONTRAST:  ISOVUE-300 IOPAMIDOL (ISOVUE-300) INJECTION 61% COMPARISON:  None. FINDINGS: There are minimal atelectatic changes of the lung bases. Small scattered nodular densities at the left lung base measuring up to 7 mm (series 201, image 8). Chest CT may provide complete evaluation of the lungs. No intra-abdominal free air. There is small inflammatory fluid in the upper abdomen. The liver appears unremarkable. There multiple small stones within the gallbladder. No pericholecystic fluid. There is no biliary ductal dilatation. No stone identified within the central CBD. First there is inflammatory changes of the pancreas predominantly involving the body and tail of the pancreas compatible with acute pancreatitis. There is no drainable fluid collection/ abscess or pseudocyst. The spleen, adrenal glands, kidneys, visualized ureters, and urinary bladder appear unremarkable. Hysterectomy. There is no evidence of bowel obstruction or active inflammation. Normal appendix. The abdominal aorta and IVC appear unremarkable. The origins of the celiac axis, SMA, IMA as well as the origins of the renal arteries are patent. The SMV, splenic vein, and main portal vein are patent. No portal venous gas identified. There is no adenopathy. There is a small fat containing umbilical hernia. There is mild degenerative changes of the spine. Old-appearing left T12 transverse process fracture. No acute fracture. IMPRESSION: Acute pancreatitis. No abscess. Correlation with pancreatic enzymes recommended. Cholelithiasis. **An incidental finding of potential clinical significance has been found. Left lower lobe pulmonary nodules measuring up to 7 mm. Non-contrast chest CT at 3-6 months is recommended. If the nodules are stable at time of repeat CT, then future CT at 18-24  months (from  today's scan) is considered optional for low-risk patients, but is recommended for high-risk patients. This recommendation follows the consensus statement: Guidelines for Management of Incidental Pulmonary Nodules Detected on CT Images:From the Fleischner Society 2017; published online before print (10.1148/[id number]).** Electronically Signed   By: Elgie CollardArash  Radparvar M.D.   On: 05/20/2016 22:06   [place] Abdomen Limited Ruq  Result Date: 05/21/2016 CLINICAL DATA:  Acute pancreatitis. EXAM: US ABDOMEN LIMITED - RIGHT UPPER QUADRANT COMPARISON:  CT 05/20/2016 FINDINGS: Gallbladder: The gallbladder is full of shadowing stones. Gallbladder wall thickness is 4 mm. No Murphy sign. Common bile duct: Diameter: Normal at 2 mm. Liver: Echogenicity normal.  No focal lesion. IMPRESSION: The gallbladder is full of shadowing stones. Mild gallbladder wall thickening. No pericholecystic fluid or ductal dilatation. No Murphy sign. Electronically Signed   By: Paulina FusiMark  Shogry M.D.   On: 05/21/2016 20:55    Results/Tests Pending at Time of Discharge: none  Discharge Medications:    Medication List    TAKE these medications   CALTRATE 600+D PO Take 1 tablet by mouth at bedtime.   docusate sodium 100 MG capsule Commonly known as:  COLACE Take 1 capsule (100 mg total) by mouth 2 (two) times daily.   OMEGA 3 PO Take 1 capsule by mouth at bedtime.   OVER THE COUNTER MEDICATION Place 1 drop into both eyes 2 (two) times daily as needed (dry eyes). Over the counter lubricating eye drops   oxyCODONE 5 MG immediate release tablet Commonly known as:  Oxy IR/ROXICODONE Take 1 tablet (5 mg total) by mouth every 4 (four) hours as needed for severe pain.   polyethylene glycol packet Commonly known as:  MIRALAX Take 17 g by mouth daily.   ranitidine 75 MG tablet Commonly known as:  ZANTAC Take 75 mg by mouth 2 (two) times daily as needed for heartburn (stomach pain).   VITAMIN B-12 PO Take 1 tablet by mouth at  bedtime.       Discharge Instructions: Please refer to Patient Instructions section of EMR for full details.  Patient was counseled important signs and symptoms that should prompt return to medical care, changes in medications, dietary instructions, activity restrictions, and follow up appointments.   Follow-Up Appointments: Follow-up Information    [facility] [place] .   Specialty:  General Surgery Why:  Your appointment is 06/05/16 at 12:15pm, please arrive at least 30 min before your appointment to complete your check in paperwork.  If you are unable to arrive 30 min prior to your appointment time we may have to cancel or reschedule you. Contact information: [address] [place] [id number] [phone]       Howard PouchLauren Feng, MD Follow up on 06/21/2016.   Why:  1:30 pm Contact information: [address] [place] [address] [phone]            Arvilla Marketatherine Lauren Wallace, DO 05/24/2016, 2:21 PM PGY-2, [facility]

## 2016-05-23 ENCOUNTER — Inpatient Hospital Stay (HOSPITAL_COMMUNITY): Payer: Self-pay

## 2016-05-23 ENCOUNTER — Inpatient Hospital Stay (HOSPITAL_COMMUNITY): Payer: Self-pay | Admitting: Critical Care Medicine

## 2016-05-23 ENCOUNTER — Encounter (HOSPITAL_COMMUNITY)
Admission: EM | Disposition: A | Discharge status: Home / Self Care | Payer: Self-pay | Source: Home / Self Care | Attending: Family Medicine

## 2016-05-23 ENCOUNTER — Encounter (HOSPITAL_COMMUNITY): Payer: Self-pay | Admitting: Critical Care Medicine

## 2016-05-23 HISTORY — PX: CHOLECYSTECTOMY: SHX55

## 2016-05-23 LAB — COMPREHENSIVE METABOLIC PANEL
ALK PHOS: 67 U/L (ref 38–126)
ALT: 122 U/L — ABNORMAL HIGH (ref 14–54)
ANION GAP: 9 (ref 5–15)
AST: 64 U/L — ABNORMAL HIGH (ref 15–41)
Albumin: 3.3 g/dL — ABNORMAL LOW (ref 3.5–5.0)
BILIRUBIN TOTAL: 0.6 mg/dL (ref 0.3–1.2)
BUN: <5 mg/dL — ABNORMAL LOW (ref 6–20)
CALCIUM: 8.3 mg/dL — AB (ref 8.9–10.3)
CO2: 20 mmol/L — ABNORMAL LOW (ref 22–32)
Chloride: 111 mmol/L (ref 101–111)
Creatinine, Ser: 0.46 mg/dL (ref 0.44–1.00)
GFR calc non Af Amer: >60 mL/min (ref >60)
Glucose, Bld: 96 mg/dL (ref 65–99)
Potassium: 3.5 mmol/L (ref 3.5–5.1)
Sodium: 140 mmol/L (ref 135–145)
TOTAL PROTEIN: 6.2 g/dL — AB (ref 6.5–8.1)

## 2016-05-23 LAB — CBC
HCT: 38.6 % (ref 36.0–46.0)
HEMOGLOBIN: 13 g/dL (ref 12.0–15.0)
MCH: 29.7 pg (ref 26.0–34.0)
MCHC: 33.7 g/dL (ref 30.0–36.0)
MCV: 88.3 fL (ref 78.0–100.0)
Platelets: 227 10*3/uL (ref 150–400)
RBC: 4.37 MIL/uL (ref 3.87–5.11)
RDW: 13 % (ref 11.5–15.5)
WBC: 8.7 10*3/uL (ref 4.0–10.5)

## 2016-05-23 LAB — SURGICAL PCR SCREEN
MRSA, PCR: NEGATIVE
STAPHYLOCOCCUS AUREUS: NEGATIVE

## 2016-05-23 SURGERY — LAPAROSCOPIC CHOLECYSTECTOMY WITH INTRAOPERATIVE CHOLANGIOGRAM
Anesthesia: General | Site: Abdomen

## 2016-05-23 MED ORDER — BUPIVACAINE-EPINEPHRINE 0.25% -1:200000 IJ SOLN
INTRAMUSCULAR | Status: DC | PRN
  Administered 2016-05-23: 9 mL

## 2016-05-23 MED ORDER — ONDANSETRON HCL 4 MG/2ML IJ SOLN
INTRAMUSCULAR | Status: AC
  Filled 2016-05-23: qty 2

## 2016-05-23 MED ORDER — LIDOCAINE 2% (20 MG/ML) 5 ML SYRINGE
INTRAMUSCULAR | Status: AC
  Filled 2016-05-23: qty 5

## 2016-05-23 MED ORDER — FENTANYL CITRATE (PF) 100 MCG/2ML IJ SOLN
INTRAMUSCULAR | Status: AC
  Filled 2016-05-23: qty 2

## 2016-05-23 MED ORDER — PROPOFOL 10 MG/ML IV BOLUS
INTRAVENOUS | Status: AC
  Filled 2016-05-23: qty 20

## 2016-05-23 MED ORDER — FENTANYL CITRATE (PF) 100 MCG/2ML IJ SOLN
25.0000 ug | INTRAMUSCULAR | Status: DC | PRN
  Administered 2016-05-23 (×2): 50 ug via INTRAVENOUS

## 2016-05-23 MED ORDER — DEXAMETHASONE SODIUM PHOSPHATE 10 MG/ML IJ SOLN
INTRAMUSCULAR | Status: AC
  Filled 2016-05-23: qty 1

## 2016-05-23 MED ORDER — HEMOSTATIC AGENTS (NO CHARGE) OPTIME
TOPICAL | Status: DC | PRN
  Administered 2016-05-23: 1 via TOPICAL

## 2016-05-23 MED ORDER — FENTANYL CITRATE (PF) 100 MCG/2ML IJ SOLN
INTRAMUSCULAR | Status: AC
  Administered 2016-05-23: 50 ug via INTRAVENOUS
  Filled 2016-05-23: qty 2

## 2016-05-23 MED ORDER — 0.9 % SODIUM CHLORIDE (POUR BTL) OPTIME
TOPICAL | Status: DC | PRN
  Administered 2016-05-23: 1000 mL

## 2016-05-23 MED ORDER — PROPOFOL 10 MG/ML IV BOLUS
INTRAVENOUS | Status: DC | PRN
  Administered 2016-05-23: 10 mg via INTRAVENOUS
  Administered 2016-05-23: 120 mg via INTRAVENOUS
  Administered 2016-05-23 (×3): 10 mg via INTRAVENOUS

## 2016-05-23 MED ORDER — IOPAMIDOL (ISOVUE-300) INJECTION 61%
INTRAVENOUS | Status: AC
  Filled 2016-05-23: qty 50

## 2016-05-23 MED ORDER — ROCURONIUM BROMIDE 10 MG/ML (PF) SYRINGE
PREFILLED_SYRINGE | INTRAVENOUS | Status: AC
  Filled 2016-05-23: qty 10

## 2016-05-23 MED ORDER — OXYCODONE HCL 5 MG PO TABS
5.0000 mg | ORAL_TABLET | ORAL | Status: DC | PRN
  Administered 2016-05-23 – 2016-05-24 (×3): 5 mg via ORAL
  Filled 2016-05-23 (×3): qty 1

## 2016-05-23 MED ORDER — GLYCOPYRROLATE 0.2 MG/ML IV SOSY
PREFILLED_SYRINGE | INTRAVENOUS | Status: AC
  Filled 2016-05-23: qty 3

## 2016-05-23 MED ORDER — NEOSTIGMINE METHYLSULFATE 5 MG/5ML IV SOSY
PREFILLED_SYRINGE | INTRAVENOUS | Status: AC
  Filled 2016-05-23: qty 5

## 2016-05-23 MED ORDER — SODIUM CHLORIDE 0.9 % IV SOLN
INTRAVENOUS | Status: DC | PRN
  Administered 2016-05-23: 13 mL

## 2016-05-23 MED ORDER — NEOSTIGMINE METHYLSULFATE 10 MG/10ML IV SOLN
INTRAVENOUS | Status: DC | PRN
  Administered 2016-05-23: 3 mg via INTRAVENOUS

## 2016-05-23 MED ORDER — GLYCOPYRROLATE 0.2 MG/ML IJ SOLN
INTRAMUSCULAR | Status: DC | PRN
  Administered 2016-05-23: 0.4 mg via INTRAVENOUS
  Administered 2016-05-23: 0.2 mg via INTRAVENOUS

## 2016-05-23 MED ORDER — BUPIVACAINE-EPINEPHRINE (PF) 0.25% -1:200000 IJ SOLN
INTRAMUSCULAR | Status: AC
  Filled 2016-05-23: qty 30

## 2016-05-23 MED ORDER — LACTATED RINGERS IV SOLN
INTRAVENOUS | Status: DC
  Administered 2016-05-23: 09:00:00 via INTRAVENOUS

## 2016-05-23 MED ORDER — ROCURONIUM BROMIDE 100 MG/10ML IV SOLN
INTRAVENOUS | Status: DC | PRN
  Administered 2016-05-23: 40 mg via INTRAVENOUS

## 2016-05-23 MED ORDER — LIDOCAINE HCL (CARDIAC) 20 MG/ML IV SOLN
INTRAVENOUS | Status: DC | PRN
  Administered 2016-05-23: 60 mg via INTRAVENOUS

## 2016-05-23 MED ORDER — FENTANYL CITRATE (PF) 100 MCG/2ML IJ SOLN
INTRAMUSCULAR | Status: DC | PRN
  Administered 2016-05-23 (×2): 50 ug via INTRAVENOUS
  Administered 2016-05-23: 100 ug via INTRAVENOUS

## 2016-05-23 MED ORDER — ONDANSETRON HCL 4 MG/2ML IJ SOLN
INTRAMUSCULAR | Status: DC | PRN
  Administered 2016-05-23: 4 mg via INTRAVENOUS

## 2016-05-23 MED ORDER — SODIUM CHLORIDE 0.9 % IR SOLN
Status: DC | PRN
  Administered 2016-05-23: 1000 mL

## 2016-05-23 MED ORDER — DEXAMETHASONE SODIUM PHOSPHATE 10 MG/ML IJ SOLN
INTRAMUSCULAR | Status: DC | PRN
  Administered 2016-05-23: 10 mg via INTRAVENOUS

## 2016-05-23 MED ORDER — MIDAZOLAM HCL 2 MG/2ML IJ SOLN
INTRAMUSCULAR | Status: AC
  Filled 2016-05-23: qty 2

## 2016-05-23 SURGICAL SUPPLY — 51 items
APL SKNCLS STERI-STRIP NONHPOA (GAUZE/BANDAGES/DRESSINGS) ×1
APPLIER CLIP ROT 10 11.4 M/L (STAPLE) ×3
APR CLP MED LRG 11.4X10 (STAPLE) ×1
BAG SPEC RTRVL LRG 6X4 10 (ENDOMECHANICALS) ×1
BENZOIN TINCTURE PRP APPL 2/3 (GAUZE/BANDAGES/DRESSINGS) ×3 IMPLANT
BLADE SURG ROTATE 9660 (MISCELLANEOUS) IMPLANT
CANISTER SUCTION 2500CC (MISCELLANEOUS) ×3 IMPLANT
CHLORAPREP W/TINT 26ML (MISCELLANEOUS) ×3 IMPLANT
CLIP APPLIE ROT 10 11.4 M/L (STAPLE) ×1 IMPLANT
CLOSURE WOUND 1/2 X4 (GAUZE/BANDAGES/DRESSINGS) ×1
COVER MAYO STAND STRL (DRAPES) ×3 IMPLANT
COVER SURGICAL LIGHT HANDLE (MISCELLANEOUS) ×3 IMPLANT
DRAPE C-ARM 42X72 X-RAY (DRAPES) ×3 IMPLANT
DRSG TEGADERM 2-3/8X2-3/4 SM (GAUZE/BANDAGES/DRESSINGS) ×5 IMPLANT
DRSG TEGADERM 4X4.75 (GAUZE/BANDAGES/DRESSINGS) ×3 IMPLANT
ELECT REM PT RETURN 9FT ADLT (ELECTROSURGICAL) ×3
ELECTRODE REM PT RTRN 9FT ADLT (ELECTROSURGICAL) ×1 IMPLANT
FILTER SMOKE EVAC LAPAROSHD (FILTER) ×1 IMPLANT
GAUZE SPONGE 2X2 8PLY STRL LF (GAUZE/BANDAGES/DRESSINGS) ×1 IMPLANT
GLOVE BIO SURGEON STRL SZ7 (GLOVE) ×5 IMPLANT
GLOVE BIOGEL PI IND STRL 6.5 (GLOVE) IMPLANT
GLOVE BIOGEL PI IND STRL 7.0 (GLOVE) IMPLANT
GLOVE BIOGEL PI IND STRL 7.5 (GLOVE) ×1 IMPLANT
GLOVE BIOGEL PI INDICATOR 6.5 (GLOVE) ×2
GLOVE BIOGEL PI INDICATOR 7.0 (GLOVE) ×2
GLOVE BIOGEL PI INDICATOR 7.5 (GLOVE) ×2
GLOVE SURG SS PI 7.0 STRL IVOR (GLOVE) ×2 IMPLANT
GOWN STRL REUS W/ TWL LRG LVL3 (GOWN DISPOSABLE) ×3 IMPLANT
GOWN STRL REUS W/TWL LRG LVL3 (GOWN DISPOSABLE) ×12
HEMOSTAT SNOW SURGICEL 2X4 (HEMOSTASIS) ×2 IMPLANT
KIT BASIN OR (CUSTOM PROCEDURE TRAY) ×3 IMPLANT
KIT ROOM TURNOVER OR (KITS) ×3 IMPLANT
NS IRRIG 1000ML POUR BTL (IV SOLUTION) ×3 IMPLANT
PAD ARMBOARD 7.5X6 YLW CONV (MISCELLANEOUS) ×3 IMPLANT
POUCH SPECIMEN RETRIEVAL 10MM (ENDOMECHANICALS) ×3 IMPLANT
SCISSORS LAP 5X35 DISP (ENDOMECHANICALS) ×3 IMPLANT
SET CHOLANGIOGRAPH 5 50 .035 (SET/KITS/TRAYS/PACK) ×3 IMPLANT
SET IRRIG TUBING LAPAROSCOPIC (IRRIGATION / IRRIGATOR) ×3 IMPLANT
SLEEVE ENDOPATH XCEL 5M (ENDOMECHANICALS) ×5 IMPLANT
SPECIMEN JAR SMALL (MISCELLANEOUS) ×3 IMPLANT
SPONGE GAUZE 2X2 STER 10/PKG (GAUZE/BANDAGES/DRESSINGS) ×2
STRIP CLOSURE SKIN 1/2X4 (GAUZE/BANDAGES/DRESSINGS) ×2 IMPLANT
SUT MNCRL AB 4-0 PS2 18 (SUTURE) ×3 IMPLANT
SUT VICRYL 0 UR6 27IN ABS (SUTURE) ×2 IMPLANT
TOWEL OR 17X24 6PK STRL BLUE (TOWEL DISPOSABLE) ×3 IMPLANT
TOWEL OR 17X26 10 PK STRL BLUE (TOWEL DISPOSABLE) ×3 IMPLANT
TRAY LAPAROSCOPIC MC (CUSTOM PROCEDURE TRAY) ×3 IMPLANT
TROCAR XCEL BLUNT TIP 100MML (ENDOMECHANICALS) ×3 IMPLANT
TROCAR XCEL NON-BLD 11X100MML (ENDOMECHANICALS) ×3 IMPLANT
TROCAR XCEL NON-BLD 5MMX100MML (ENDOMECHANICALS) ×3 IMPLANT
TUBING INSUFFLATION (TUBING) ×3 IMPLANT

## 2016-05-23 NOTE — Op Note (Addendum)
Laparoscopic Cholecystectomy with IOC Procedure Note  Indications: This patient presents with symptomatic gallbladder disease and will undergo laparoscopic cholecystectomy.  Pre-operative Diagnosis: Gallstone pancreatitis  Post-operative Diagnosis: same; chronic cholecystitis  Surgeon: Lenardo Westwood K.   Assistants: none  Anesthesia: General endotracheal anesthesia  ASA Class: 2  Procedure Details  The patient was seen again in the Holding Room. The risks, benefits, complications, treatment options, and expected outcomes were discussed with the patient. The possibilities of reaction to medication, pulmonary aspiration, perforation of viscus, bleeding, recurrent infection, finding a normal gallbladder, the need for additional procedures, failure to diagnose a condition, the possible need to convert to an open procedure, and creating a complication requiring transfusion or operation were discussed with the patient. The likelihood of improving the patient's symptoms with return to their baseline status is good.  The patient and/or family concurred with the proposed plan, giving informed consent. The site of surgery properly noted. The patient was taken to Operating Room, identified as [name] and the procedure verified as Laparoscopic Cholecystectomy with Intraoperative Cholangiogram. A Time Out was held and the above information confirmed.  Prior to the induction of general anesthesia, antibiotic prophylaxis was administered. General endotracheal anesthesia was then administered and tolerated well. After the induction, the abdomen was prepped with Chloraprep and draped in sterile fashion. The patient was positioned in the supine position.  Local anesthetic agent was injected into the skin near the umbilicus and an incision made. We dissected down to the abdominal fascia with blunt dissection.  The fascia was incised vertically and we entered the peritoneal cavity bluntly.  A pursestring  suture of 0-Vicryl was placed around the fascial opening.  The Hasson cannula was inserted and secured with the stay suture.  Pneumoperitoneum was then created with CO2 and tolerated well without any adverse changes in the patient's vital signs. An 11-mm port was placed in the subxiphoid position.  Two 5-mm ports were placed in the right upper quadrant. All skin incisions were infiltrated with a local anesthetic agent before making the incision and placing the trocars.   We positioned the patient in reverse Trendelenburg, tilted slightly to the patient's left.  There are significant adhesions to the surface of the gallbladder.  The gallbladder was identified, the fundus grasped and retracted cephalad. Adhesions were lysed bluntly and with the electrocautery where indicated, taking care not to injure any adjacent organs or viscus. The infundibulum was grasped and retracted laterally, exposing the peritoneum overlying the triangle of Calot. This was then divided and exposed in a blunt fashion. A critical view of the cystic duct and cystic artery was obtained.  The cystic duct was clearly identified and bluntly dissected circumferentially. The cystic duct was ligated with a clip distally.   An incision was made in the cystic duct and the [facility] cholangiogram catheter introduced. The catheter was secured using a clip. A cholangiogram was then obtained which showed good visualization of the distal and proximal biliary tree with no sign of filling defects or obstruction.  Contrast flowed easily into the duodenum. The catheter was then removed.   The cystic duct was then ligated with clips and divided. The cystic artery was identified, dissected free, ligated with clips and divided as well.   The gallbladder was dissected from the liver bed in retrograde fashion with the electrocautery. The gallbladder was removed and placed in an Endocatch sac. The liver bed was irrigated and inspected. Hemostasis was achieved with  the electrocautery and Surgicel SNOW. Copious irrigation was utilized  and was repeatedly aspirated until clear.  The gallbladder and Endocatch sac were then removed through the umbilical port site.  The pursestring suture was used to close the umbilical fascia.    We again inspected the right upper quadrant for hemostasis.  Pneumoperitoneum was released as we removed the trocars.  4-0 Monocryl was used to close the skin.   Benzoin, steri-strips, and clean dressings were applied. The patient was then extubated and brought to the recovery room in stable condition. Instrument, sponge, and needle counts were correct at closure and at the conclusion of the case.   Findings: Cholecystitis with Cholelithiasis  Estimated Blood Loss: less than 50 mL         Drains: none         Specimens: Gallbladder           Complications: None; patient tolerated the procedure well.         Disposition: PACU - hemodynamically stable.         Condition: stable  Donna ArmsMatthew K. Corliss Mccann, [name], [name]  General/ Trauma Surgery  05/23/2016 10:09 AM

## 2016-05-23 NOTE — Anesthesia Procedure Notes (Signed)
Procedure Name: Intubation Date/Time: 05/23/2016 8:45 AM Performed by: Glo HerringLEE, Hal Norrington B Pre-anesthesia Checklist: Patient identified, Emergency Drugs available, Suction available, Patient being monitored and Timeout performed Patient Re-evaluated:Patient Re-evaluated prior to inductionOxygen Delivery Method: Circle system utilized Preoxygenation: Pre-oxygenation with 100% oxygen Intubation Type: IV induction and Cricoid Pressure applied Ventilation: Mask ventilation without difficulty Laryngoscope Size: Mac and 3 Grade View: Grade III Tube type: Oral Tube size: 7.0 mm Number of attempts: 1 Airway Equipment and Method: Stylet Placement Confirmation: breath sounds checked- equal and bilateral,  CO2 detector,  ETT inserted through vocal cords under direct vision and positive ETCO2 Secured at: 21 cm Tube secured with: Tape Dental Injury: Teeth and Oropharynx as per pre-operative assessment

## 2016-05-23 NOTE — Anesthesia Preprocedure Evaluation (Signed)
Anesthesia Evaluation  Patient identified by MRN, date of birth, ID band Patient awake    Reviewed: Allergy & Precautions, H&P , Patient's Chart, lab work & pertinent test results, reviewed documented beta blocker date and time   Airway Mallampati: II  TM Distance: >3 FB Neck ROM: full    Dental no notable dental hx.    Pulmonary    Pulmonary exam normal breath sounds clear to auscultation       Cardiovascular Exercise Tolerance: Good  Rhythm:regular Rate:Normal     Neuro/Psych    GI/Hepatic   Endo/Other    Renal/GU      Musculoskeletal   Abdominal   Peds  Hematology   Anesthesia Other Findings   Reproductive/Obstetrics                             Anesthesia Physical Anesthesia Plan  ASA: II  Anesthesia Plan: General   Post-op Pain Management:    Induction: Intravenous  Airway Management Planned: Oral ETT  Additional Equipment:   Intra-op Plan:   Post-operative Plan: Extubation in OR  Informed Consent: I have reviewed the patients History and Physical, chart, labs and discussed the procedure including the risks, benefits and alternatives for the proposed anesthesia with the patient or authorized representative who has indicated his/her understanding and acceptance.   Dental Advisory Given and Dental advisory given  Plan Discussed with: CRNA and Surgeon  Anesthesia Plan Comments: (  Discussed general anesthesia, including possible nausea, instrumentation of airway, sore throat,pulmonary aspiration, etc. I asked if the were any outstanding questions, or  concerns before we proceeded. )        Anesthesia Quick Evaluation

## 2016-05-23 NOTE — Transfer of Care (Signed)
Immediate Anesthesia Transfer of Care Note  Patient: [name]  Procedure(s) Performed: Procedure(s): LAPAROSCOPIC CHOLECYSTECTOMY WITH INTRAOPERATIVE CHOLANGIOGRAM (N/A)  Patient Location: PACU  Anesthesia Type:General  Level of Consciousness: awake, alert  and oriented  Airway & Oxygen Therapy: Patient Spontanous Breathing and Patient connected to nasal cannula oxygen  Post-op Assessment: Report given to RN, Post -op Vital signs reviewed and stable and Patient moving all extremities X 4  Post vital signs: Reviewed and stable  Last Vitals:  Vitals:   05/22/16 2111 05/23/16 0522  BP: 129/71 132/60  Pulse: 68 (!) 53  Resp: 20 16  Temp: 36.9 C 36.7 C   HR 79, Sats 95% on 2 L [place]y RR 20, BP 146/80  Last Pain:  Vitals:   05/23/16 0522  TempSrc: Oral  PainSc:       Patients Stated Pain Goal: 0 (05/21/16 0049)  Complications: No apparent anesthesia complications

## 2016-05-23 NOTE — Progress Notes (Signed)
Family Medicine Teaching Service Daily Progress Note Intern Pager: [phone]  Patient name: [name] Medical record number: [id number] Date of birth: 08/31/1961 Age: 55 y.o. Gender: female  Primary Care Provider: Howard PouchLauren Feng, MD Consultants: none Code Status: FULL  Pt Overview and Major Events to Date:  55 yo female presented with abdominal pain, nausea, vomiting. Treating for pancreatitis. Symptoms resolving.  Cholecystectomy 05/23/16.  Assessment and Plan:  [name] is a 55 y.o. female presenting with abdominal pain and nausea and vomiting.   # Pancreatitis, Acute: Acute onset severe epigastric pain w/ N/V. Lipase 6,600. CT abdomen shows evidence suggesting cholelithiasis and pancreatitis. U/S showed gallbladder full of stones, no ductal dilatation. Last triglycerides were 189. No history of alcohol use. Biliary obstruction 2/2 cholelith possible etiology. - zofran prn for nausea - cholecystectomy performed 05/23/16 - oxy 5 mg prn post op pain - advance diet as tolerated  # Elevated liver enzymes -trending down, today AST 64, ALT 122  # Lung nodules: Small scattered nodular densities at the left lung base measuring up to 7 mm noted on CT, possibly an incidental finding. Patient has no smoking history, no reported known exposure to TB.  -Per radiology- Non-contrast chest CT at 6-12 months is recommended. If the nodule is stable at time of repeat CT, then future CT at 18-24 months (from 05/21/16 scan) is considered optional for low-risk patients, but is recommended for high-risk patients  FEN/GI: Soft diet Prophylaxis: Lovenox   Disposition: discharge home after recovery from surgery  Subjective:  [name] tolerated surgery well. Denies nausea, vomiting. Rates pain 5/10 and requests medication. Has not yet urinated since operation.   Objective: Temp:  [98 F (36.7 C)-98.5 F (36.9 C)] 98 F (36.7 C) (09/07 0522) Pulse Rate:  [53-68] 53 (09/07 0522) Resp:   [16-20] 16 (09/07 0522) BP: (129-132)/(60-71) 132/60 (09/07 0522) SpO2:  [98 %-99 %] 98 % (09/07 0522) Weight:  [65.5 kg (144 lb 4.8 oz)] 65.5 kg (144 lb 4.8 oz) (09/07 0422)  Physical Exam: General: well appearing female laying in bed in NAD HEENT: EOMI, sclera anicteric Cardiovascular: RRR, no murmur Respiratory: CTAB with no increased work of breathing Abdomen: soft, non-distended, bandages clean, dry and intact. Bowel sounds present. Skin: warm, dry, no rashes; no evidence of Gray-turner sign Extremities: no edema or cyanosis Neuro: alert, oriented, no focal deficits  Laboratory:  Recent Labs Lab 05/20/16 1702 05/21/16 0048  WBC 9.2 8.1  HGB 13.3 12.7  HCT 39.9 38.6  PLT 232 229    Recent Labs Lab 05/21/16 0048 05/22/16 0548 05/23/16 0334  NA 139 140 140  K 4.1 3.4* 3.5  CL 109 105 111  CO2 22 23 20*  BUN 10 <5* <5*  CREATININE 0.72 0.51 0.46  CALCIUM 8.8* 7.9* 8.3*  PROT 6.9 5.9* 6.2*  BILITOT 0.6 0.7 0.6  ALKPHOS 85 67 67  ALT 326* 166* 122*  AST 419* 104* 64*  GLUCOSE 132* 95 96    Imaging/Diagnostic Tests: RUQ US 05/21/16 Impression: The gallbladder is full of shadowing stones. Mild gallbladder wall thickening. No pericholecystic fluid or ductal dilatation. No Murphy sign.  Ernestina ColumbiaPaige Schmidt, Medical Student 05/23/2016, 8:39 AM [facility] FPT[facility] Intern pager: [phone], text pages welcome   RESIDENT ADDENDUM  I have separately seen and examined the patient. I have discussed the findings and exam with the medical student and agree with the above note, which I have edited appropriately. I helped develop the management plan that is described in  the student's note, and I agree with the content.  Additionally I have outlined my exam and assessment/plan below:   Patient doing well post-operatively. Had an acidic/bad feeling after drinking Ginger Ale. Has not had any solids.  Pain well controlled on orals, however they do make her sleepy. Has not  urinated since procedure 4-5hrs ago. No nausea or vomiting.   PE:  Blood pressure 137/72, pulse (!) 55, temperature 98 F (36.7 C), temperature source Oral, resp. rate 20, height 5\' 2"  (1.575 m), weight 144 lb 4.8 oz (65.5 kg), SpO2 96 %. Gen: pleasant, in NAD  MMM, oropharynx clear Mildly bradycardic. Normal rate, no m/r/g noted. CTAB without wheezing, rhonchi, or crackles +BS, surgical bandages clean, dry, intact. Unable to palpate too intensely due to mild pain but do not feel a distended bladder.   A/P:  55 y/o presenting with pancreatitis s/p laparoscopic cholecystectomy today.  No fevers, overall doing well Pain controlled with orals Barriers to d/c: needs to eat solids, needs to urinate, pt currently on CTX, unsure if she should be on antibiotics on d/c as surgery has not yet dropped recommendations (I suspect not but would like to be sure).    Joanna Puff, MD, PGY-3,  [facility] 05/23/2016  5:29 PM

## 2016-05-23 NOTE — Progress Notes (Signed)
Patient back from OR after a laparoscopic cholecystectomy. Alert and oriented x4, no complaints of pain or other discomfort. Steri-strips and clean dressing on 5 abdominal ports. Will continue to monitor.

## 2016-05-24 ENCOUNTER — Ambulatory Visit
Payer: No Typology Code available for payment source | Admitting: Student in an Organized Health Care Education/Training Program

## 2016-05-24 ENCOUNTER — Encounter (HOSPITAL_COMMUNITY): Payer: Self-pay | Admitting: Surgery

## 2016-05-24 LAB — COMPREHENSIVE METABOLIC PANEL
ALK PHOS: 66 U/L (ref 38–126)
ALT: 128 U/L — ABNORMAL HIGH (ref 14–54)
ANION GAP: 10 (ref 5–15)
AST: 82 U/L — ABNORMAL HIGH (ref 15–41)
Albumin: 3.1 g/dL — ABNORMAL LOW (ref 3.5–5.0)
BILIRUBIN TOTAL: 0.7 mg/dL (ref 0.3–1.2)
BUN: <5 mg/dL — ABNORMAL LOW (ref 6–20)
CALCIUM: 8.6 mg/dL — AB (ref 8.9–10.3)
CO2: 26 mmol/L (ref 22–32)
Chloride: 106 mmol/L (ref 101–111)
Creatinine, Ser: 0.55 mg/dL (ref 0.44–1.00)
GFR calc non Af Amer: >60 mL/min (ref >60)
Glucose, Bld: 130 mg/dL — ABNORMAL HIGH (ref 65–99)
Potassium: 4.1 mmol/L (ref 3.5–5.1)
Sodium: 142 mmol/L (ref 135–145)
TOTAL PROTEIN: 6.3 g/dL — AB (ref 6.5–8.1)

## 2016-05-24 LAB — LIPASE, BLOOD: Lipase: 34 U/L (ref 11–51)

## 2016-05-24 MED ORDER — POLYETHYLENE GLYCOL 3350 17 G PO PACK: 17.0000 g | PACK | Freq: Every day | ORAL | 0 refills | Status: AC

## 2016-05-24 MED ORDER — OXYCODONE HCL 5 MG PO TABS: 5.0000 mg | ORAL_TABLET | ORAL | 0 refills | Status: DC | PRN

## 2016-05-24 MED ORDER — DOCUSATE SODIUM 100 MG PO CAPS: 100.0000 mg | ORAL_CAPSULE | Freq: Two times a day (BID) | ORAL | 0 refills | Status: DC

## 2016-05-24 NOTE — Progress Notes (Signed)
Patient was discharged home by MD order; discharged instructions  review and give to patient with care notes and prescription; IV DIC; patient will be escorted to the car by a volunteer via wheelchair.  

## 2016-05-24 NOTE — Care Management Note (Addendum)
Case Management Note  Patient Details  Name: Donna PapasJosefina Quezada MRN: [id number] Date of Birth: 05/04/1961  Subjective/Objective:       Admitted with acute abdominal pain/ pancreatitis. From home with family . Independent with ADL's PTA, no DME usage.         - s/p lap. Cholecystectomy 05/23/2016  Action/Plan:  Pt without insurance, [facility]. Information provided to pt per CM. Plan is to d/c to home today. [facility]  Expected Discharge Date:    05/24/2016             Expected Discharge Plan:  Home/Self Care (resides with husband and son)  In-House Referral:     Discharge planning Services  CM Consult  PostStatus of Service:  completed  If discussed at [employer] Length of Stay Meetings, dates discussed:    Additional Comments:  Epifanio LeschesCole, Lemar Bakos Hudson, RN 05/24/2016, 10:16 AM

## 2016-05-24 NOTE — Progress Notes (Signed)
1 Day Post-Op  Subjective: Patient comfortable - tolerating liquids Minimal pain No nausea or vomiting  Objective: Vital signs in last 24 hours: Temp:  [98 F (36.7 C)-98.6 F (37 C)] 98.5 F (36.9 C) (09/08 0519) Pulse Rate:  [55-72] 63 (09/08 0519) Resp:  [16-22] 18 (09/08 0519) BP: (112-150)/(57-84) 112/57 (09/08 0519) SpO2:  [94 %-99 %] 96 % (09/08 0519) Weight:  [66.6 kg (146 lb 14.4 oz)] 66.6 kg (146 lb 14.4 oz) (09/08 0500) Last BM Date: 05/22/16  Intake/Output from previous day: 09/07 0701 - 09/08 0700 In: 2216.7 [P.O.:270; I.V.:1946.7] Out: 50 [Blood:50] Intake/Output this shift: No intake/output data recorded.  General appearance: alert, cooperative and no distress GI: soft, minimal tenderness, + BS Incisions - dressing clean and dry  Lab Results:   Recent Labs  05/23/16 1307  WBC 8.7  HGB 13.0  HCT 38.6  PLT 227   BMET  Recent Labs  05/23/16 0334 05/24/16 0549  NA 140 142  K 3.5 4.1  CL 111 106  CO2 20* 26  GLUCOSE 96 130*  BUN <5* <5*  CREATININE 0.46 0.55  CALCIUM 8.3* 8.6*   Hepatic Function Latest Ref Rng & Units 05/24/2016 05/23/2016 05/22/2016  Total Protein 6.5 - 8.1 g/dL 6.3(L) 6.2(L) 5.9(L)  Albumin 3.5 - 5.0 g/dL 3.1(L) 3.3(L) 3.2(L)  AST 15 - 41 U/L 82(H) 64(H) 104(H)  ALT 14 - 54 U/L 128(H) 122(H) 166(H)  Alk Phosphatase 38 - 126 U/L 66 67 67  Total Bilirubin 0.3 - 1.2 mg/dL 0.7 0.6 0.7   Lipase     Component Value Date/Time   LIPASE 34 05/24/2016 0549    PT/INR No results for input(s): LABPROT, INR in the last 72 hours. ABG No results for input(s): PHART, HCO3 in the last 72 hours.  Invalid input(s): PCO2, PO2  Studies/Results: Dg Cholangiogram Operative  Result Date: 05/23/2016 CLINICAL DATA:  Pancreatitis EXAM: INTRAOPERATIVE CHOLANGIOGRAM TECHNIQUE: Cholangiographic images from the C-arm fluoroscopic device were submitted for interpretation post-operatively. Please see the procedural report for the amount of contrast  and the fluoroscopy time utilized. COMPARISON:  None. FINDINGS: Contrast fills the biliary tree and duodenum without filling defects in the common bile duct. IMPRESSION: Patent biliary tree. Electronically Signed   By: Marybelle Killings M.D.   On: 05/23/2016 09:41    Anti-infectives: Anti-infectives    Start     Dose/Rate Route Frequency Ordered Stop   05/22/16 1800  cefTRIAXone (ROCEPHIN) 2 g in dextrose 5 % 50 mL IVPB     2 g 100 mL/hr over 30 Minutes Intravenous Every 24 hours 05/22/16 1622        Assessment/Plan: s/p Procedure(s): LAPAROSCOPIC CHOLECYSTECTOMY WITH INTRAOPERATIVE CHOLANGIOGRAM (N/A) Gallstone pancreatitis - resolved  Chronic calculus cholecystitis  Ready for discharge from surgical standpoint Discharge instructions and follow-up in computer Will need rx for pain meds  LOS: 3 days    Korby Ratay K. 05/24/2016

## 2016-06-14 NOTE — Anesthesia Postprocedure Evaluation (Signed)
Anesthesia Post Note  Patient: [name]  Procedure(s) Performed: Procedure(s) (LRB): LAPAROSCOPIC CHOLECYSTECTOMY WITH INTRAOPERATIVE CHOLANGIOGRAM (N/A)  Patient location during evaluation: PACU Anesthesia Type: General Level of consciousness: sedated Pain management: satisfactory to patient Vital Signs Assessment: post-procedure vital signs reviewed and stable Respiratory status: spontaneous breathing Cardiovascular status: stable Anesthetic complications: no     Last Vitals:  Vitals:   05/23/16 2120 05/24/16 0519  BP: 118/70 (!) 112/57  Pulse: 63 63  Resp: 18 18  Temp: 36.8 C 36.9 C    Last Pain:  Vitals:   05/24/16 0613  TempSrc:   PainSc: Asleep   Pain Goal: Patients Stated Pain Goal: 0 (05/24/16 0530)               Cristela BlueJACKSON,[name]

## 2016-06-20 NOTE — Progress Notes (Signed)
   CC: Hospital follow up, healthy lifestyle modifications  HPI: [name] is a 55 y.o. female with PMH pancreatitis s/p cholecystectomy 05/2016 and incidental lung nodule that will require re-imaging in 11/2016 [facility].  She also has been making postive lifestyle changes and would like to speak about diet.  S/p cholecystectomy and hospitalization with pancreatitis (05/2016) Patient reports doing well since her surgery.  She has had no pain, no N/V/D/C. She has implemented lifestyle changes including diet modifications as well as regular exercise since hospital discharge.  Patient was found to have incidental lung nodule on imaging during this hospitalization which requires follow up imaging 6 months and 18 months after initial CT, and she would like clarification regarding the nodule and how it is followed.  Prediabetes and cholesterol (tested 03/2016) Nutritional Assessment Learning Readiness: Change in progress Usual eating pattern includes 3 meals and 1 snack per day.   Usual physical activity includes - walking 4 miles daily (newly implemented over past 1-2 months). 24-hr recall: (Up at 6 AM) Breakfast (10 AM)-  Coffee, 1-2 slices of bread  Lunch ( 3pm  PM) - [place], Chicken, beans, broccoli, carrots   Snack ( PM)- Papaya, green blend smoothie, Cactus Dinner (7 or 8 PM) - Cereal, milk, squash  Typical day? Yes.    Review of Symptoms: See HPI for ROS.   CC, SH/smoking status, and VS noted.  Objective: BP 117/69 (BP Location: Left Arm, Patient Position: Sitting, Cuff Size: Normal)   Pulse 69   Temp 98.3 F (36.8 C) (Oral)   Wt 141 lb 3.2 oz (64 kg)   SpO2 97%   BMI 25.83 kg/m  GEN: NAD, alert, cooperative, and pleasant. EYE: no conjunctival injection, pupils equally round and reactive to light ENMT: normal tympanic light reflex, no nasal polyps,no rhinorrhea, no pharyngeal erythema or exudates NECK: full ROM, no  thyromegally RESPIRATORY: clear to auscultation bilaterally with no wheezes, rhonchi or rales, good effort CV: RRR, no m/r/g, no peripheral edema GI: soft, non-tender, non-distended, normoactive bowel sounds, no hepatosplenomegaly SKIN: warm and dry.  4 well-healing laparoscopic scars on abdomen that are clean, dry and intact   Assessment and plan:  Pulmonary nodules Incidental finding on CT abdomen which was followed up with CT chest.  - Non-contrast CT at 6-12 months (11/2016 will be 6 months) - If the nodule is stable at the time of the repeat CT, may do future CT at 18-24 months from original scan   Prediabetes HbA1c 5.8 consistent with prediabetes in 03/2016. - will not repeat A1c today - Nutrition assessment done on today's visit, counseling provided for further dietary modifications - patient has lost 8 pounds from previous visit two months ago.  Continue to encourage healthy diet and exercise.  Healthcare maintenance Patient to do one-time screen for HIV, Hep C today. Also to receive flu shot today.    Howard PouchLauren Ashwika Freels, MD,MS,  PGY1 06/21/2016 4:48 PM

## 2016-06-21 ENCOUNTER — Encounter: Payer: Self-pay | Admitting: Student in an Organized Health Care Education/Training Program

## 2016-06-21 ENCOUNTER — Ambulatory Visit (INDEPENDENT_AMBULATORY_CARE_PROVIDER_SITE_OTHER): Payer: Self-pay | Admitting: Student in an Organized Health Care Education/Training Program

## 2016-06-21 VITALS — BP 117/69 | HR 69 | Temp 98.3°F | Wt 141.2 lb

## 2016-06-21 DIAGNOSIS — Z0000 Encounter for general adult medical examination without abnormal findings: Secondary | ICD-10-CM | POA: Insufficient documentation

## 2016-06-21 DIAGNOSIS — R7303 Prediabetes: Secondary | ICD-10-CM

## 2016-06-21 DIAGNOSIS — E789 Disorder of lipoprotein metabolism, unspecified: Secondary | ICD-10-CM

## 2016-06-21 DIAGNOSIS — Z23 Encounter for immunization: Secondary | ICD-10-CM

## 2016-06-21 DIAGNOSIS — R918 Other nonspecific abnormal finding of lung field: Secondary | ICD-10-CM

## 2016-06-21 NOTE — Assessment & Plan Note (Deleted)
ASCVD Risk score 1.8%.  10-year risk <5%.  - No statin recommended at this time.  - Continue to encourage diet, exercise, lifestyle interventions.

## 2016-06-21 NOTE — Patient Instructions (Signed)
It was a pleasure seeing you today in our clinic. Today we discussed the nodule on your lung as well as your condition after your surgery. Here is the treatment plan we have discussed and agreed upon together: - We will take another picture of your lungs in March to be sure there is no change in the lung nodule - Great job with your diet and exercise! Keep up the good work, it sounds like you are making some very positive changes that will continue to benefit your health. - We will do routine screens for HIV and Hep C at today's visit, and I will send you a letter when these results will become available - We will do the flu vaccine today  Please follow up in March so that we can order the repeat picture of your lungs, or sooner as you need.  Our clinic's number is [phone]. Please call with questions or concerns about what we discussed today.  - Dr. Mosetta PuttFeng

## 2016-06-21 NOTE — Assessment & Plan Note (Addendum)
HbA1c 5.8 consistent with prediabetes in 03/2016. - will not repeat A1c today - Nutrition assessment done on today's visit, counseling provided for further dietary modifications - patient has lost 8 pounds from previous visit two months ago.  Continue to encourage healthy diet and exercise.

## 2016-06-21 NOTE — Assessment & Plan Note (Signed)
Patient to do one-time screen for HIV, Hep C today. Also to receive flu shot today.

## 2016-06-21 NOTE — Assessment & Plan Note (Addendum)
Incidental finding on CT abdomen which was followed up with CT chest.  - Non-contrast CT at 6-12 months (11/2016 will be 6 months) - If the nodule is stable at the time of the repeat CT, may do future CT at 18-24 months from original scan

## 2016-06-22 LAB — HIV ANTIBODY (ROUTINE TESTING W REFLEX): HIV: NONREACTIVE

## 2016-06-22 LAB — HEPATITIS C ANTIBODY: HCV Ab: NEGATIVE

## 2016-06-23 ENCOUNTER — Encounter: Payer: Self-pay | Admitting: Student in an Organized Health Care Education/Training Program

## 2016-11-18 ENCOUNTER — Telehealth: Payer: Self-pay

## 2016-11-18 NOTE — Telephone Encounter (Signed)
Health Coaching #3  Initial Screening: 03/29/16  Interpretor: Delorise RoyalsJulie Sowell  Goal: Patients goal was to eat 6-7 small meals daily and decrease sugars. Patient states she is following these guidelines. Encouraged to keep up the good work.   Navigation: Time spent with patient via phone: 5 Minutes, patient understands there will be a final follow up call in 4- 6 weeks.

## 2017-04-09 ENCOUNTER — Other Ambulatory Visit: Payer: Self-pay | Admitting: Obstetrics and Gynecology

## 2017-04-09 DIAGNOSIS — Z1231 Encounter for screening mammogram for malignant neoplasm of breast: Secondary | ICD-10-CM

## 2017-04-24 ENCOUNTER — Encounter (HOSPITAL_COMMUNITY): Payer: Self-pay

## 2017-04-24 ENCOUNTER — Ambulatory Visit
Admission: RE | Admit: 2017-04-24 | Discharge: 2017-04-24 | Disposition: A | Discharge status: Home / Self Care | Payer: Self-pay | Source: Ambulatory Visit | Attending: Obstetrics and Gynecology | Admitting: Obstetrics and Gynecology

## 2017-04-24 ENCOUNTER — Ambulatory Visit (HOSPITAL_COMMUNITY)
Admission: RE | Admit: 2017-04-24 | Discharge: 2017-04-24 | Disposition: A | Discharge status: Home / Self Care | Payer: Self-pay | Source: Ambulatory Visit | Attending: Obstetrics and Gynecology | Admitting: Obstetrics and Gynecology

## 2017-04-24 VITALS — BP 132/85 | HR 76 | Temp 98.0°F | Ht 62.0 in | Wt 149.4 lb

## 2017-04-24 DIAGNOSIS — Z1239 Encounter for other screening for malignant neoplasm of breast: Secondary | ICD-10-CM

## 2017-04-24 DIAGNOSIS — Z1231 Encounter for screening mammogram for malignant neoplasm of breast: Secondary | ICD-10-CM

## 2017-04-24 NOTE — Progress Notes (Addendum)
No complaints today.   Pap Smear:  Pap smear not completed today. Last Pap smear was in 2007 and normal per patient. Per patient has no history of an abnormal Pap smear. Patient has a history of a hysterectomy 06/19/2006 for endometriosis. Patient no longer needs Pap smears due to her history of a hysterectomy for benign reasons per BCCCP and ACOG guidelines. No Pap smear results are in EPIC.  Physical exam: Breasts Breasts symmetrical. No skin abnormalities bilateral breasts. No nipple retraction bilateral breasts. No nipple discharge bilateral breasts. No lymphadenopathy. No lumps palpated bilateral breasts. No complaints of pain or tenderness on exam. [facility]. Appointment scheduled for Thursday, April 24, 2017 at 0940.        Pelvic/Bimanual No Pap smear completed today since patient has a history of a hysterectomy for benign reasons. Pap smear not indicated per BCCCP guidelines.   Smoking History: Patient is occasional smoker. Discussed smoking cessation with patient. Referred to the [facility] and gave smoking cessation resources.  Patient Navigation: Patient education provided. [facility]. Spanish interpreter provided.  Colorectal Cancer Screening: Per patient has never had a colonoscopy completed. No complaints today. FIT Test given to patient to complete and return to BCCCP.  Used Spanish interpreter [employer]anca Lindner from [place].

## 2017-04-24 NOTE — Patient Instructions (Addendum)
Explained breast self awareness with [name]. Patient did not need a Pap smear today since patient has a history of a hysterectomy for benign reasons. Let patient know that she doesn't need any further Pap smears due to her history of a hysterectomy for benign reasons. [facility] mamm[facility]. Appointment scheduled for Thursday, April 24, 2017 at 0940. Let patient know the Breast Center will follow up with her within the next couple weeks with results of mammogram by letter or phone. Discussed smoking cessation with patient. [facility] the [facility] and gave smoking cessation resources. [name] verbalized understanding.  Paden Kuras, Kathaleen Maserhristine Poll, RN 11:41 AM

## 2017-04-24 NOTE — Addendum Note (Signed)
Encounter addended by: Priscille HeidelbergBrannock, Kaiyu Mirabal P, RN on: 04/24/2017  2:11 PM<BR>    Actions taken: Sign clinical note

## 2017-05-16 ENCOUNTER — Other Ambulatory Visit: Payer: Self-pay | Admitting: Obstetrics and Gynecology

## 2017-05-23 ENCOUNTER — Encounter (HOSPITAL_COMMUNITY): Payer: Self-pay

## 2017-05-27 LAB — FECAL OCCULT BLOOD, IMMUNOCHEMICAL: FECAL OCCULT BLD: NEGATIVE

## 2017-05-28 ENCOUNTER — Encounter (HOSPITAL_COMMUNITY): Payer: Self-pay

## 2017-06-11 ENCOUNTER — Telehealth (HOSPITAL_COMMUNITY): Payer: Self-pay

## 2017-06-11 NOTE — Telephone Encounter (Signed)
Wisewoman Follow Up  Interpreter: Delorise Royals  Medication: Patient does not take medication for high cholesterol, HTN and diabetes.   Blood Pressure, Self measurement: Patient has no reason to check her blood pressure.   Nutrition: Patient eats 1 cup of fruit and vegetables daily. She does not eat 2 servings of fish weekly. She eats 3 or more oz of whole grains daily.Patient  drinks more than 36oz of sugary beverages weekly. She watches her sodium intake.   Physical Activity: Patient states that she is walking 10 miles weekly. She is getting of vigorous activity daily.  Smoking Status: Patient states that she does not smoke.   Quality of Life Assessment: Patient denies having any mental/physical health days that keep her from doing any of her normal activities.

## 2017-07-02 ENCOUNTER — Encounter (HOSPITAL_COMMUNITY): Payer: Self-pay

## 2017-11-19 ENCOUNTER — Other Ambulatory Visit (HOSPITAL_COMMUNITY): Payer: Self-pay

## 2017-11-19 DIAGNOSIS — Z0000 Encounter for general adult medical examination without abnormal findings: Secondary | ICD-10-CM

## 2017-11-21 ENCOUNTER — Inpatient Hospital Stay: Payer: Self-pay | Attending: Obstetrics and Gynecology | Admitting: *Deleted

## 2017-11-21 ENCOUNTER — Inpatient Hospital Stay: Payer: Self-pay

## 2017-11-21 VITALS — BP 102/74

## 2017-11-21 DIAGNOSIS — Z0000 Encounter for general adult medical examination without abnormal findings: Secondary | ICD-10-CM | POA: Insufficient documentation

## 2017-11-21 LAB — LIPID PANEL
CHOLESTEROL: 267 mg/dL — AB (ref 0–200)
HDL: 66 mg/dL (ref >40)
LDL Cholesterol: 171 mg/dL — ABNORMAL HIGH (ref 0–99)
Total CHOL/HDL Ratio: 4 RATIO
Triglycerides: 151 mg/dL — ABNORMAL HIGH (ref <150)
VLDL: 30 mg/dL (ref 0–40)

## 2017-11-21 NOTE — Progress Notes (Signed)
WISEWOMAN INITIAL SCREENING   Interpreter Grecia Nandin  Clinical Measurement: HT: 60in  WT: 150lbs BP: 128/82  BPx2: 102/71 fasting labs drawn today, will review with patient when they result.  Medical History: Patient states that she has not been diagnosed with any of the following conditions: hyperlipidemia, HTN, diabetes or heart disease.   Medications: Patient does not take medications for HTN, hyperlipidemia or diabetes. She is not taking aspirin daily to prevent heart attack or stroke.   Blood Pressure, Self Measurement:Patient does not measure her blood pressure at home.   Nutrition: Patient states that she eats 1 cups of fruit , and 0 cup of vegetables in an average day. She does eat fish two or more times a week . She eats more than half a serving of whole grains daily. She drinks less than 36 ounces of beverages with added sugar weekly. She is currently watching her sodium intake. She has had 1 drink containing alcohol in the last 7 days.   Physical Activity:Patient states that she gets 90-120 minutes of moderate exercise in a week. She gets 0 minutes of vigorous exercise in a week.  Smoking Status: Patient quit smoking 6-7 months ago. She is not around any smokers.  Quality of Life: Patient states that she has has had 0 bad physical health out of the last 30 days. In the last 2 weeks she has had 0 days that she has felt down or depressed. She has had 0 days in the last 2 weeks that she has had little interest or pleasure in doing things.  Risk Reduction and Counseling: Discussed with patient ways to eat better. Encouraged patient to continue exercising.   Navigation: Will notify patient of lab results within a week. Patient aware of two more health coaching sessions and a follow up.

## 2017-11-21 NOTE — Addendum Note (Signed)
Addended by: Hart Haas on: 11/21/2017 10:11 AM   Modules accepted: Orders  

## 2017-11-21 NOTE — Addendum Note (Signed)
Addended by: Donia GuilesPLEASANT, Alahia Whicker on: 11/21/2017 10:11 AM   Modules accepted: Orders

## 2017-11-21 NOTE — Patient Instructions (Addendum)
Health Coaching: Patient wants to work on nutrition and physical activity. Patient is currently exercising and wishes to increase her routine. Pamphlets and wisewoman information given to patient. Will notify patient of lab results within a week. Patient aware of two more health coaching sessions and a follow up. Will notify patient of lab results within a week. Patient aware of two more health coaching sessions and a follow up.

## 2017-11-22 LAB — HEMOGLOBIN A1C
HEMOGLOBIN A1C: 5.8 % — AB (ref 4.8–5.6)
Mean Plasma Glucose: 120 mg/dL

## 2017-12-03 ENCOUNTER — Telehealth (HOSPITAL_COMMUNITY): Payer: Self-pay

## 2017-12-03 NOTE — Telephone Encounter (Signed)
Health Coaching #2   Interpreter: Delorise RoyalsJulie Mccann  Lab results: Phoned patient with interpreter to discuss fasting lab results total cholesterol 267, HDL 66, LDL 171, triglycerides 151, A1C 5.8 and glucose. Patient voiced understanding regarding labs.   Goals: Patient is continuing to work on her goals. Encouraged patient to keep up the good work.   Navigation: Patient agreed to a referral to internal medicine. Patient understands that there will be more follow up calls with this program.

## 2017-12-11 ENCOUNTER — Other Ambulatory Visit: Payer: Self-pay

## 2017-12-11 ENCOUNTER — Ambulatory Visit (INDEPENDENT_AMBULATORY_CARE_PROVIDER_SITE_OTHER): Payer: Self-pay | Admitting: Internal Medicine

## 2017-12-11 ENCOUNTER — Encounter: Payer: Self-pay | Admitting: Internal Medicine

## 2017-12-11 VITALS — BP 129/79 | HR 86 | Temp 97.8°F | Ht 60.0 in | Wt 151.9 lb

## 2017-12-11 DIAGNOSIS — F17201 Nicotine dependence, unspecified, in remission: Secondary | ICD-10-CM

## 2017-12-11 DIAGNOSIS — Z9049 Acquired absence of other specified parts of digestive tract: Secondary | ICD-10-CM

## 2017-12-11 DIAGNOSIS — Z0000 Encounter for general adult medical examination without abnormal findings: Secondary | ICD-10-CM

## 2017-12-11 DIAGNOSIS — Z90710 Acquired absence of both cervix and uterus: Secondary | ICD-10-CM

## 2017-12-11 DIAGNOSIS — E7800 Pure hypercholesterolemia, unspecified: Secondary | ICD-10-CM

## 2017-12-11 DIAGNOSIS — R7303 Prediabetes: Secondary | ICD-10-CM

## 2017-12-11 DIAGNOSIS — R918 Other nonspecific abnormal finding of lung field: Secondary | ICD-10-CM

## 2017-12-11 DIAGNOSIS — Z79899 Other long term (current) drug therapy: Secondary | ICD-10-CM

## 2017-12-11 MED ORDER — ATORVASTATIN CALCIUM 10 MG PO TABS: 10.0000 mg | ORAL_TABLET | Freq: Every day | ORAL | 11 refills | Status: DC

## 2017-12-11 NOTE — Progress Notes (Signed)
   CC: Establish care after being told that her cholesterol is high at health wellness exam.  HPI:  [name] is a 57 y.o. with nonsignificant past medical history came to the clinic to establish care with us.  [facility]  her wellness exam done, which shows hyperlipidemia, A1c of 5.8 and normal blood pressure.  Patient has no complaint today. She tried to eat healthy diet and exercise at least 3 days a week with walking for about 40 minutes.  She has an history of pancreatitis secondary to choledocholithiasis resulted in cholecystectomy.  She also has her hysterectomy done 2 years ago. During a CT chest done in 2017 because of her pancreatitis shows bilateral basal pulmonary nodule largest one was 7 mm, a follow-up CT was advised and 6 months which was never done as patient did not had insurance.  She denies any cough, chest pain or shortness of breath. She denies any night sweats or unintentional weight loss. She denies any recent change in her appetite or bowel habit.  Family history.  Her sister has diabetes.  Social history.  She lives with her husband and 4 kids.  She is an Futures traderhomemaker.  She quit smoking 6 months ago, denies any alcohol or illicit drug use.  No past medical history on file. Review of Systems: Negative except mentioned in HPI.  Physical Exam:  Vitals:   12/11/17 1338  BP: 129/79  Pulse: 86  Temp: 97.8 F (36.6 C)  TempSrc: Oral  SpO2: 97%  Weight: 151 lb 14.4 oz (68.9 kg)  Height: 5' (1.524 m)    General: Vital signs reviewed.  Patient is well-developed and well-nourished, in no acute distress and cooperative with exam.  Head: Normocephalic and atraumatic. Eyes: EOMI, conjunctivae normal, no scleral icterus.  Neck: Supple, trachea midline, normal ROM, no JVD, masses, thyromegaly, or carotid bruit present.  Cardiovascular: RRR, S1 normal, S2 normal, no murmurs, gallops, or rubs. Pulmonary/Chest: Clear to  auscultation bilaterally, no wheezes, rales, or rhonchi. Abdominal: Soft, non-tender, non-distended, BS +, no masses, organomegaly, or guarding present.  Musculoskeletal: No joint deformities, erythema, or stiffness, ROM full and nontender. Extremities: No lower extremity edema bilaterally,  pulses symmetric and intact bilaterally. No cyanosis or clubbing. Neurological: A&O x3, Strength is normal and symmetric bilaterally, cranial nerve II-XII are grossly intact, no focal motor deficit, sensory intact to light touch bilaterally.  Skin: Warm, dry and intact. No rashes or erythema. Psychiatric: Normal mood and affect. speech and behavior is normal. Cognition and memory are normal.  Assessment & Plan:   See Encounters Tab for problem based charting.  Patient discussed with Dr. Heide SparkNarendra.

## 2017-12-11 NOTE — Assessment & Plan Note (Signed)
She needs a follow-up CT chest to see the stability of her bilateral pulmonary nodule, incidental finding during a CT chest done in 2017. Currently having no respiratory symptoms.  -She will need a CT chest once she will have her orange card.

## 2017-12-11 NOTE — Assessment & Plan Note (Signed)
A1c checked on November 21, 2017 during wellness exam was 5.8.  Same as her previous check done in 2017.  Patient denies any polyuria or polydipsia.

## 2017-12-11 NOTE — Patient Instructions (Addendum)
Thank you for visiting clinic today. I am glad you are doing well. I am starting you on a cholesterol medicine called Lipitor at a lower dose, most common side effect is muscle pain if you experience that please let us know. I am also checking your liver enzymes before starting that medicine-will call you with any abnormal results. As we discussed you also need a repeat CT chest to follow-up on your lung nodule which was seen during a CT chest done in 2017, please follow-up once you have your orange card so we can send you for CT chest. Please follow-up within 3 months with your PCP so they can arrange more health maintenance testing once you get your orange card.

## 2017-12-11 NOTE — Assessment & Plan Note (Signed)
She had her mammogram in August 2018 which was negative for malignancy. She is due for colonoscopy-will wait until she will get her orange card. No Pap smear needed and patient has hysterectomy. We will follow-up on rest of her health care once she will have orange card.

## 2017-12-11 NOTE — Assessment & Plan Note (Signed)
Lipid profile done during wellness visit shows elevated total cholesterol and LDL. DVT risk was 5.2%-which suggested the use of moderate intensity statin.  Discussed with patient and started her on Lipitor 10 mg daily.

## 2017-12-12 LAB — CMP14 + ANION GAP
ALBUMIN: 4.5 g/dL (ref 3.5–5.5)
ALT: 25 IU/L (ref 0–32)
AST: 35 IU/L (ref 0–40)
Albumin/Globulin Ratio: 1.5 (ref 1.2–2.2)
Alkaline Phosphatase: 97 IU/L (ref 39–117)
Anion Gap: 19 mmol/L — ABNORMAL HIGH (ref 10.0–18.0)
BUN / CREAT RATIO: 20 (ref 9–23)
BUN: 13 mg/dL (ref 6–24)
Bilirubin Total: <0.2 mg/dL (ref 0.0–1.2)
CALCIUM: 9.4 mg/dL (ref 8.7–10.2)
CO2: 19 mmol/L — ABNORMAL LOW (ref 20–29)
Chloride: 106 mmol/L (ref 96–106)
Creatinine, Ser: 0.66 mg/dL (ref 0.57–1.00)
GFR, EST AFRICAN AMERICAN: 116 mL/min/{1.73_m2} (ref >59)
GFR, EST NON AFRICAN AMERICAN: 100 mL/min/{1.73_m2} (ref >59)
GLUCOSE: 108 mg/dL — AB (ref 65–99)
Globulin, Total: 3 g/dL (ref 1.5–4.5)
POTASSIUM: 4.6 mmol/L (ref 3.5–5.2)
Sodium: 144 mmol/L (ref 134–144)
TOTAL PROTEIN: 7.5 g/dL (ref 6.0–8.5)

## 2017-12-12 NOTE — Progress Notes (Signed)
Internal Medicine Clinic Attending  Case discussed with Dr. Amin at the time of the visit.  We reviewed the resident's history and exam and pertinent patient test results.  I agree with the assessment, diagnosis, and plan of care documented in the resident's note.    

## 2017-12-22 ENCOUNTER — Ambulatory Visit: Payer: Self-pay

## 2018-01-01 ENCOUNTER — Ambulatory Visit: Payer: Self-pay

## 2018-01-08 ENCOUNTER — Ambulatory Visit: Payer: Self-pay

## 2018-01-16 ENCOUNTER — Ambulatory Visit: Payer: Self-pay

## 2018-01-23 ENCOUNTER — Ambulatory Visit: Payer: Self-pay

## 2018-03-27 ENCOUNTER — Telehealth (HOSPITAL_COMMUNITY): Payer: Self-pay | Admitting: *Deleted

## 2018-03-27 NOTE — Telephone Encounter (Signed)
   Health Coaching 3  Spanish Interpreter- Delorise RoyalsJulie Sowell   Called to follow up on how patient is managing her goals.    Goals- Patient states she has lost weight and is eating more vegetables and fruit and walking every day.  Encouraged her to keep up the good work.  Navigation:    Patient is aware of a follow up health coaching.

## 2018-03-28 IMAGING — RF DG CHOLANGIOGRAM OPERATIVE
1 series · 4 of 4 positions shown · non-contrast
Comparison: None.

CLINICAL DATA: Pancreatitis

EXAM:
INTRAOPERATIVE CHOLANGIOGRAM
TECHNIQUE: Cholangiographic images from the C-arm fluoroscopic device were
submitted for interpretation post-operatively. Please see the
procedural report for the amount of contrast and the fluoroscopy
time utilized.

[Series 1: run · 4 of 34 frames shown]
[frame 6/34]
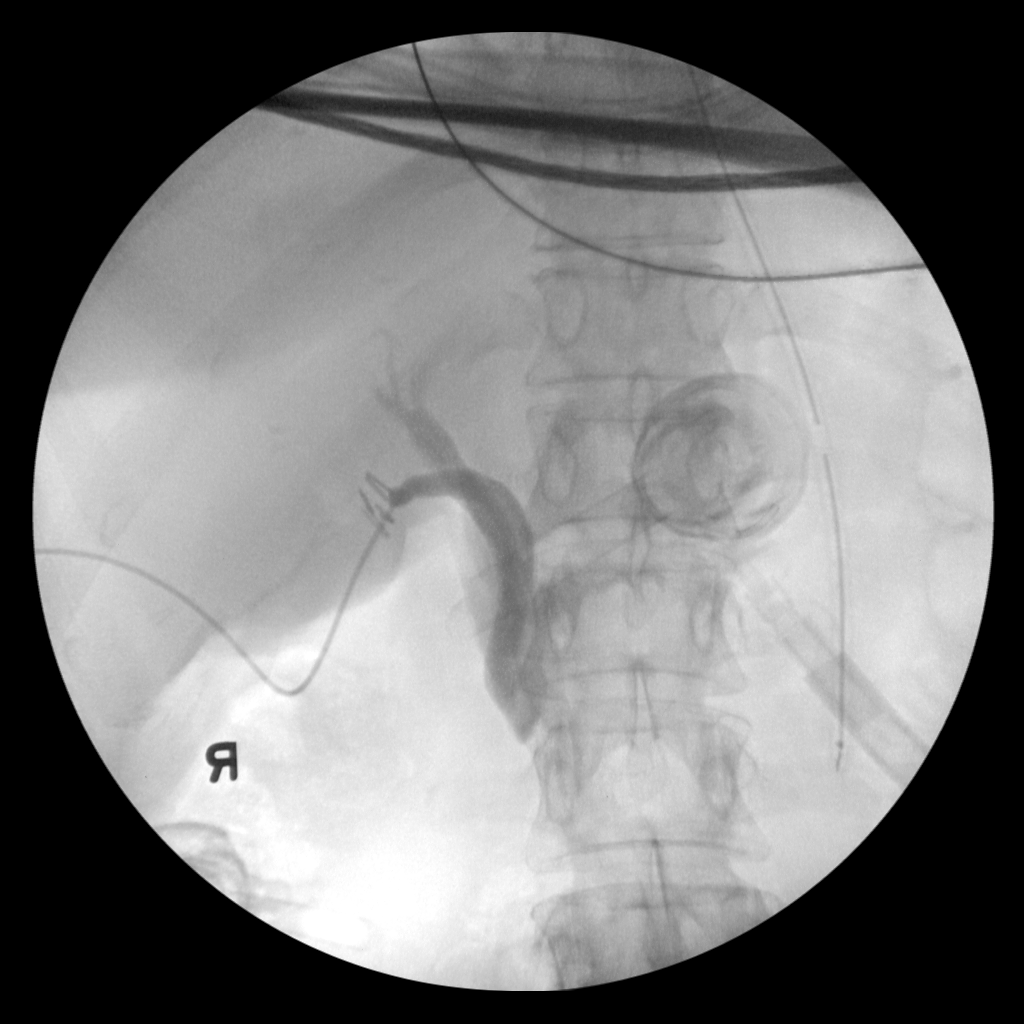
[frame 18/34]
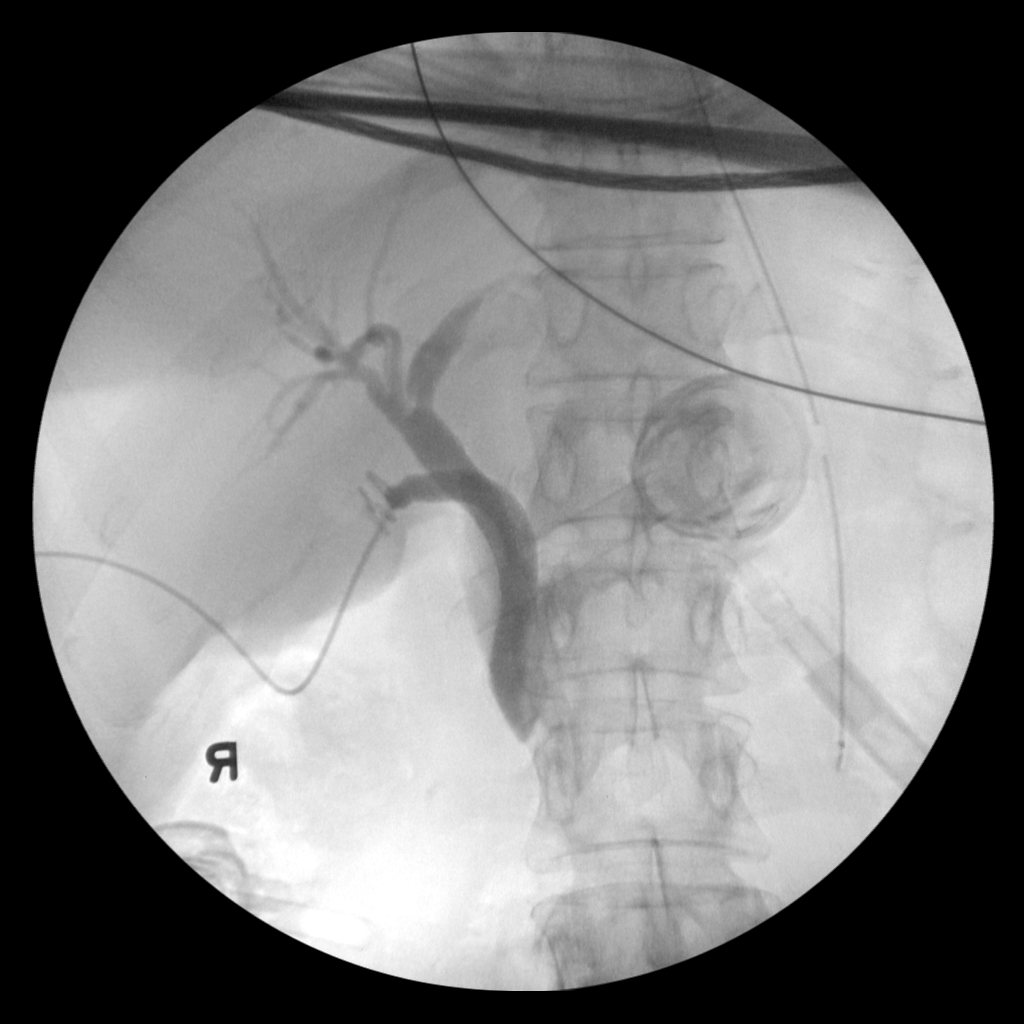
[frame 29/34]
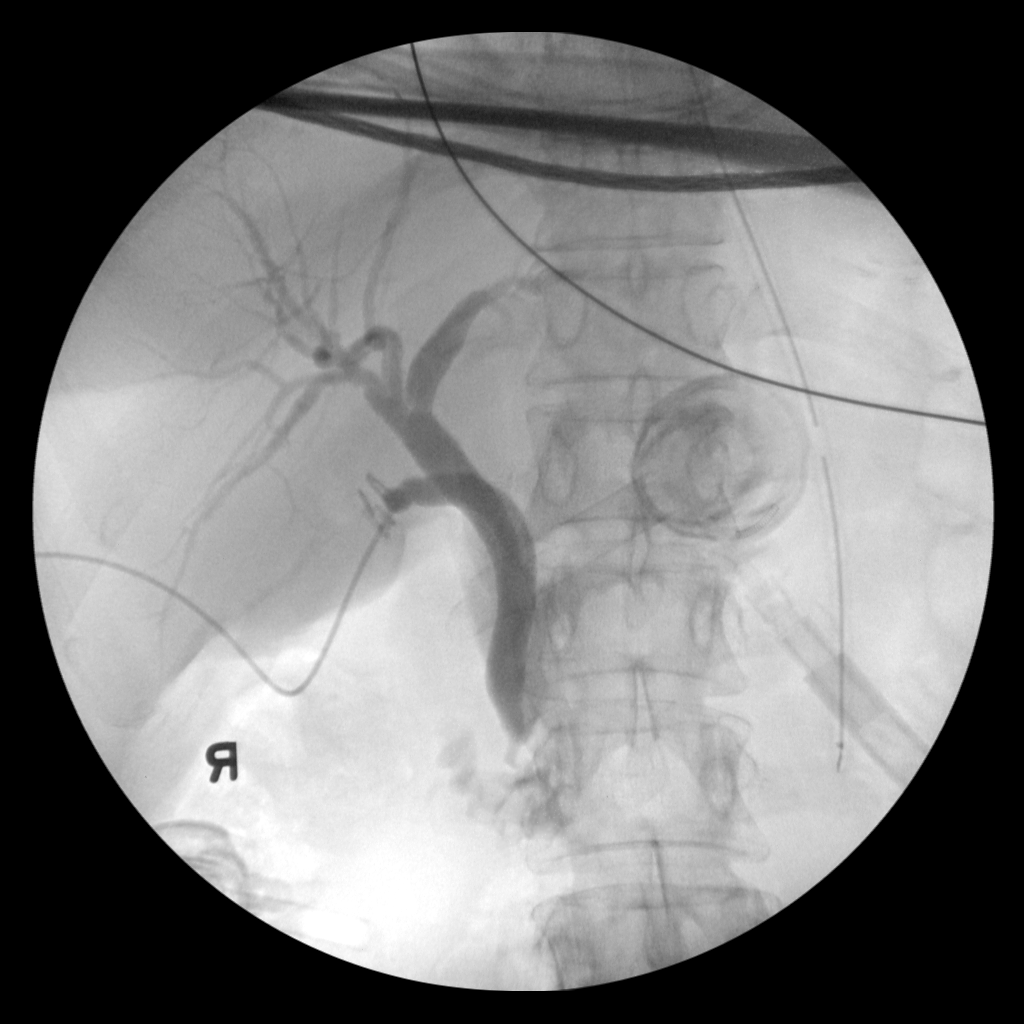
[frame 32/34]
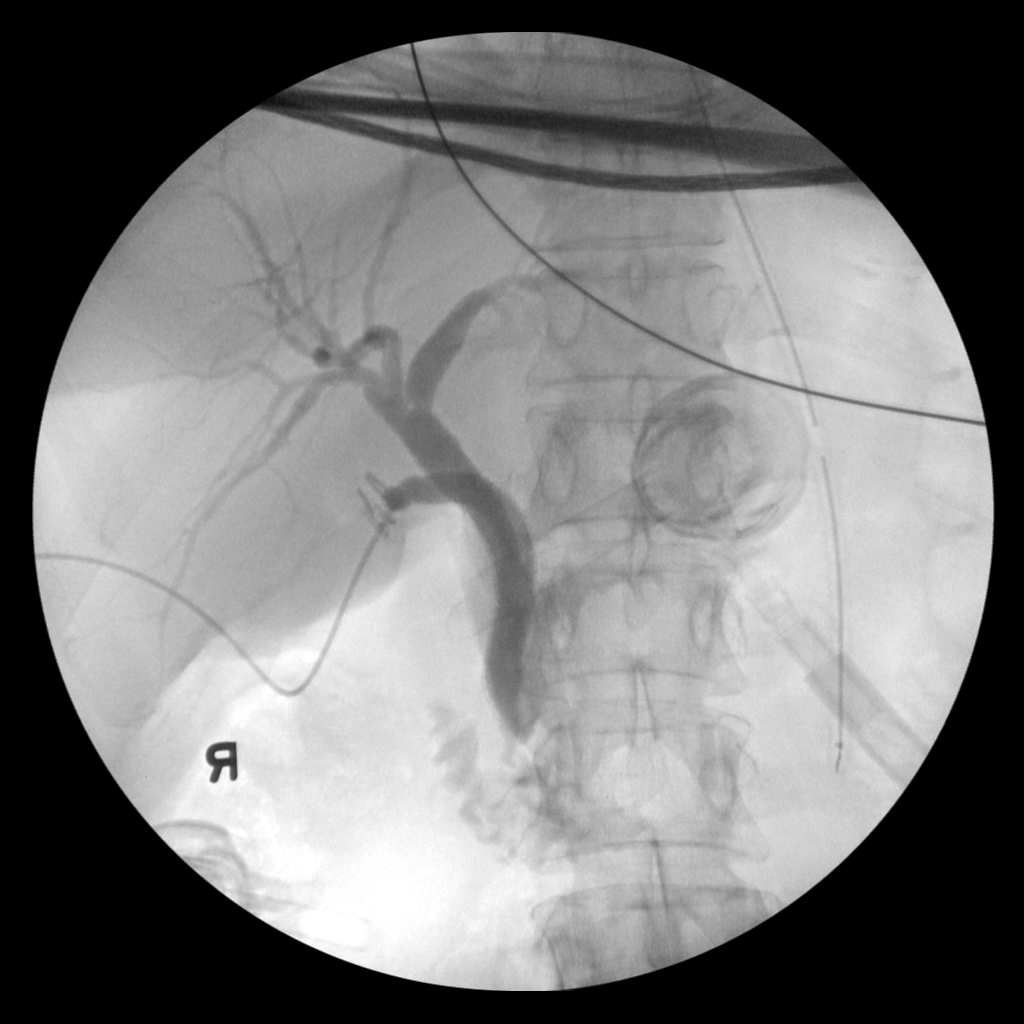

[4 of 4 positions shown; findings below may reference images not displayed]

FINDINGS: Contrast fills the biliary tree and duodenum without filling defects
in the common bile duct.
IMPRESSION: Patent biliary tree.

## 2018-04-03 ENCOUNTER — Other Ambulatory Visit: Payer: Self-pay | Admitting: Obstetrics and Gynecology

## 2018-04-03 DIAGNOSIS — Z1231 Encounter for screening mammogram for malignant neoplasm of breast: Secondary | ICD-10-CM

## 2018-04-28 ENCOUNTER — Ambulatory Visit (HOSPITAL_COMMUNITY)
Admission: RE | Admit: 2018-04-28 | Discharge: 2018-04-28 | Disposition: A | Discharge status: Home / Self Care | Payer: Self-pay | Source: Ambulatory Visit | Attending: Obstetrics and Gynecology | Admitting: Obstetrics and Gynecology

## 2018-04-28 ENCOUNTER — Encounter (HOSPITAL_COMMUNITY): Payer: Self-pay

## 2018-04-28 ENCOUNTER — Ambulatory Visit
Admission: RE | Admit: 2018-04-28 | Discharge: 2018-04-28 | Disposition: A | Discharge status: Home / Self Care | Payer: No Typology Code available for payment source | Source: Ambulatory Visit | Attending: Obstetrics and Gynecology | Admitting: Obstetrics and Gynecology

## 2018-04-28 VITALS — BP 110/70 | Ht 62.0 in

## 2018-04-28 DIAGNOSIS — Z1231 Encounter for screening mammogram for malignant neoplasm of breast: Secondary | ICD-10-CM

## 2018-04-28 DIAGNOSIS — Z1239 Encounter for other screening for malignant neoplasm of breast: Secondary | ICD-10-CM

## 2018-04-28 NOTE — Progress Notes (Signed)
No complaints today.   Pap Smear: Pap smear not completed today. Last Pap smear was in 2007 and normal per patient. Per patient has no history of an abnormal Pap smear. Patient has a history of a hysterectomy 06/19/2006 for endometriosis. Patient no longer needs Pap smears due to her history of a hysterectomy for benign reasons per BCCCP and ACOG guidelines. No Pap smear results are in EPIC.  Physical exam: Breasts Breasts symmetrical. No skin abnormalities bilateral breasts. No nipple retraction bilateral breasts. No nipple discharge bilateral breasts. No lymphadenopathy. No lumps palpated bilateral breasts. No complaints of pain or tenderness on exam. [facility]. Appointment scheduled for Tuesday, April 28, 2018 at 1240.        Pelvic/Bimanual No Pap smear completed today since patient has a history of a hysterectomy for benign reasons. Pap smear not indicated per BCCCP guidelines.   Smoking History: Patient is a former smoker that quit in April 2019.  Patient Navigation: Patient education provided. Access to services provided for patient through [facility]. Spanish interpreter provided.   Colorectal Cancer Screening: Per patient has never had a colonoscopy completed. Patient completed a FIT test 05/16/2017 that was negative. No complaints today. FIT Test given to patient to complete and return to BCCCP. Told patient to complete her FIT test the end of August or beginning of September.   Breast and Cervical Cancer Risk Assessment: Patient has a family history of her maternal grandmother having breast cancer. Patient has no known genetic mutations or history of radiation treatment to the chest before age 55. Patient has no history of cervical dysplasia, immunocompromised, or DES exposure in-utero. Risk Assessment    Risk Scores      04/28/2018   Last edited by: Priscille HeidelbergBrannock, Leotis Isham P, RN   5-year risk: 1 %   Lifetime  risk: 7 %         Used Spanish interpreter Natale LayErika McReynolds from [facility] [facility].

## 2018-04-28 NOTE — Patient Instructions (Addendum)
Explained breast self awareness with [name]. Patient did not need a Pap smear today since patient has a history of a hysterectomy for benign reasons. Let patient know that she doesn't need any further Pap smears due to her history of a hysterectomy for benign reasons. [facility] for a screening mamm[facility]. Appointment scheduled for Tuesday, April 28, 2018 at 1240. Let patient know the Breast Center will follow up with her within the next couple weeks with results of mammogram by letter or phone. [name] verbalized understanding.  Madisyn Mawhinney, Kathaleen Maserhristine Poll, RN 11:44 AM

## 2018-05-14 ENCOUNTER — Ambulatory Visit: Payer: Self-pay

## 2018-06-30 ENCOUNTER — Other Ambulatory Visit: Payer: Self-pay

## 2018-07-17 LAB — FECAL OCCULT BLOOD, IMMUNOCHEMICAL

## 2018-08-11 ENCOUNTER — Encounter (HOSPITAL_COMMUNITY): Payer: Self-pay

## 2018-08-11 ENCOUNTER — Telehealth (HOSPITAL_COMMUNITY): Payer: Self-pay

## 2018-08-11 NOTE — Telephone Encounter (Signed)
Called patient with interpreter Joslyn HyErica McReynolds to let patient know that the FIT Test that was completed could not be analyzed by the lab due to the age of the specimen. Let the patient know that she would need to complete a new one and mail back to us. New FIT Test will be mailed to the patient. Patient voiced understanding.

## 2018-08-25 ENCOUNTER — Other Ambulatory Visit: Payer: Self-pay | Admitting: Obstetrics and Gynecology

## 2018-09-09 LAB — FECAL OCCULT BLOOD, IMMUNOCHEMICAL: FECAL OCCULT BLD: NEGATIVE

## 2018-09-11 ENCOUNTER — Encounter (HOSPITAL_COMMUNITY): Payer: Self-pay

## 2019-09-01 ENCOUNTER — Other Ambulatory Visit (HOSPITAL_COMMUNITY): Payer: Self-pay | Admitting: *Deleted

## 2019-09-01 DIAGNOSIS — Z1231 Encounter for screening mammogram for malignant neoplasm of breast: Secondary | ICD-10-CM

## 2019-10-26 ENCOUNTER — Ambulatory Visit
Admission: RE | Admit: 2019-10-26 | Discharge: 2019-10-26 | Disposition: A | Discharge status: Home / Self Care | Payer: No Typology Code available for payment source | Source: Ambulatory Visit | Attending: Obstetrics and Gynecology | Admitting: Obstetrics and Gynecology

## 2019-10-26 ENCOUNTER — Other Ambulatory Visit: Payer: Self-pay

## 2019-10-26 ENCOUNTER — Encounter (HOSPITAL_COMMUNITY): Payer: Self-pay

## 2019-10-26 ENCOUNTER — Ambulatory Visit (HOSPITAL_COMMUNITY)
Admission: RE | Admit: 2019-10-26 | Discharge: 2019-10-26 | Disposition: A | Discharge status: Home / Self Care | Payer: No Typology Code available for payment source | Source: Ambulatory Visit | Attending: Obstetrics and Gynecology | Admitting: Obstetrics and Gynecology

## 2019-10-26 DIAGNOSIS — Z1231 Encounter for screening mammogram for malignant neoplasm of breast: Secondary | ICD-10-CM

## 2019-10-26 DIAGNOSIS — Z1239 Encounter for other screening for malignant neoplasm of breast: Secondary | ICD-10-CM | POA: Insufficient documentation

## 2019-10-26 NOTE — Patient Instructions (Signed)
Explained breast self awareness with Thornton Papas. Patient did not need a Pap smear today due to last Pap smear due to her history of a hysterectomy for benign reasons. Let patient know that she doesn't need any further Pap smears due to her history of a hysterectomy for benign reasons. Referred patient to the [facility] for a screening mammogram. Appointment scheduled for Tuesday, October 26, 2019 at 1030.      Patient aware of appointment and will be there. Let patient know the Breast Center will follow up with her within the next couple weeks with results of mammogram by letter or phone. Thornton Papas verbalized understanding.  Fraida Veldman, Kathaleen Maser, RN 10:22 AM

## 2019-10-26 NOTE — Progress Notes (Signed)
No complaints today.   Pap Smear: Pap smear not completed today. Last Pap smear was 10/20/2009 and normal with fungus. Per patient has no history of an abnormal Pap smear. Patient has a history of a hysterectomy 06/19/2006 for endometriosis. Patient no longer needs Pap smears due to her history of a hysterectomy for benign reasons per BCCCP and ASCCP guidelines.Last Pap smear result is in EPIC.  Physical exam: Breasts Breasts symmetrical. No skin abnormalities bilateral breasts. No nipple retraction bilateral breasts. No nipple discharge bilateral breasts. No lymphadenopathy. No lumps palpated bilateral breasts. No complaints of pain or tenderness on exam. Referred patient to the [facility] for a screening mammogram. Appointment scheduled for Tuesday, October 26, 2019 at 1030.        Pelvic/Bimanual No Pap smear completed today sincepatient has a history of a hysterectomy for benign reasons. Pap smear not indicated per BCCCP guidelines.  Smoking History: Patient is a former smoker that quit in April 2019.  Patient Navigation: Patient education provided. Access to services provided for patient through [facility] program. Spanish interpreter provided.   Colorectal Cancer Screening: Per patient has never had a colonoscopy completed. Patient completed a FIT test 10/26/2017 and 05/16/2017 that were both negative. No complaints today.   Breast and Cervical Cancer Risk Assessment: Patient has a family history of her maternal grandmother having breast cancer. Patient has no known genetic mutations or history of radiation treatment to the chest before age 11. Patient has no history of cervical dysplasia, immunocompromised, or DES exposure in-utero.  Risk Assessment    Risk Scores      10/26/2019 04/28/2018   Last edited by: Narda Rutherford, LPN Mekiah Wahler, Carlye Grippe, RN   5-year risk: 1 % 1 %   Lifetime risk: 6.8 % 7 %         Used Spanish interpreter Natale Lay from [place].

## 2020-01-17 ENCOUNTER — Ambulatory Visit: Payer: Self-pay | Attending: Internal Medicine

## 2020-01-17 DIAGNOSIS — Z23 Encounter for immunization: Secondary | ICD-10-CM

## 2020-01-17 NOTE — Progress Notes (Signed)
   Covid-19 Vaccination Clinic  Name:  Artesia Berkey    MRN: [id number] DOB: 1961/01/15  01/17/2020  Ms. Bravo was observed post Covid-19 immunization for 15 minutes without incident. She was provided with Vaccine Information Sheet and instruction to access the V-Safe system.   Ms. Ancil Linsey was instructed to call 911 with any severe reactions post vaccine: Marland Kitchen Difficulty breathing  . Swelling of face and throat  . A fast heartbeat  . A bad rash all over body  . Dizziness and weakness   Immunizations Administered    Name Date Dose VIS Date Route   Pfizer COVID-19 Vaccine 01/17/2020  3:39 PM 0.3 mL 11/10/2018 Intramuscular   Manufacturer: [employer], [employer]   Lot: [id number]   NDC: [address]-3

## 2020-02-07 ENCOUNTER — Ambulatory Visit: Payer: Self-pay | Attending: Internal Medicine

## 2020-02-07 DIAGNOSIS — Z23 Encounter for immunization: Secondary | ICD-10-CM

## 2020-02-07 NOTE — Progress Notes (Signed)
   Covid-19 Vaccination Clinic  Name:  [name]    MRN: [id number] DOB: 09/03/1961  02/07/2020  [name] was observed post Covid-19 immunization for 15 minutes without incident. She was provided with Vaccine Information Sheet and instruction to access the V-Safe system.   [name] was instructed to call 911 with any severe reactions post vaccine: Marland Kitchen Difficulty breathing  . Swelling of face and throat  . A fast heartbeat  . A bad rash all over body  . Dizziness and weakness   Immunizations Administered    Name Date Dose VIS Date Route   Pfizer COVID-19 Vaccine 02/07/2020  4:08 PM 0.3 mL 11/10/2018 Intramuscular   Manufacturer: [employer], [employer]   Lot: [id number]   NDC: [address]-8

## 2020-10-11 ENCOUNTER — Other Ambulatory Visit: Payer: Self-pay | Admitting: Obstetrics and Gynecology

## 2020-10-11 DIAGNOSIS — Z1231 Encounter for screening mammogram for malignant neoplasm of breast: Secondary | ICD-10-CM

## 2020-11-14 ENCOUNTER — Ambulatory Visit: Payer: No Typology Code available for payment source

## 2020-12-12 ENCOUNTER — Other Ambulatory Visit: Payer: Self-pay

## 2020-12-12 ENCOUNTER — Ambulatory Visit
Admission: RE | Admit: 2020-12-12 | Discharge: 2020-12-12 | Disposition: A | Discharge status: Home / Self Care | Payer: No Typology Code available for payment source | Source: Ambulatory Visit | Attending: Obstetrics and Gynecology | Admitting: Obstetrics and Gynecology

## 2020-12-12 ENCOUNTER — Ambulatory Visit: Payer: Self-pay | Admitting: *Deleted

## 2020-12-12 VITALS — BP 144/82 | Wt 147.8 lb

## 2020-12-12 DIAGNOSIS — Z1239 Encounter for other screening for malignant neoplasm of breast: Secondary | ICD-10-CM

## 2020-12-12 DIAGNOSIS — Z1231 Encounter for screening mammogram for malignant neoplasm of breast: Secondary | ICD-10-CM

## 2020-12-12 NOTE — Progress Notes (Signed)
Ms. Natalye Kott is a 60 y.o. female who presents to [facility] clinic today with no complaints.    Pap Smear: Pap smear not completed today. Last Pap smear was 10/20/2009 and normal with fungus. Per patient has no history of an abnormal Pap smear. Patient has a history of a hysterectomy 06/19/2006 for endometriosis. Patient no longer needs Pap smears due to her history of a hysterectomy for benign reasons per BCCCP and ASCCP guidelines.Last Pap smear result is available in Epic.   Physical exam: Breasts Breasts symmetrical. No skin abnormalities bilateral breasts. No nipple retraction bilateral breasts. No nipple discharge bilateral breasts. No lymphadenopathy. No lumps palpated bilateral breasts. No complaints of pain or tenderness on exam.      MS DIGITAL SCREENING BILATERAL  Result Date: 04/24/2017 CLINICAL DATA:  Screening. EXAM: DIGITAL SCREENING BILATERAL MAMMOGRAM WITH CAD COMPARISON:  Previous exam(s). ACR Breast Density Category b: There are scattered areas of fibroglandular density. FINDINGS: There are no findings suspicious for malignancy. Images were processed with CAD. IMPRESSION: No mammographic evidence of malignancy. A result letter of this screening mammogram will be mailed directly to the patient. RECOMMENDATION: Screening mammogram in one year. (Code:SM-B-01Y) BI-RADS CATEGORY  1: Negative. Electronically Signed   By: Bary Richard M.D.   On: 04/24/2017 16:45   MS DIGITAL SCREENING BILATERAL  Result Date: 03/15/2016 CLINICAL DATA:  Screening. EXAM: DIGITAL SCREENING BILATERAL MAMMOGRAM WITH CAD COMPARISON:  Previous exam(s). ACR Breast Density Category c: The breast tissue is heterogeneously dense, which may obscure small masses. FINDINGS: There are no findings suspicious for malignancy. Images were processed with CAD. IMPRESSION: No mammographic evidence of malignancy. A result letter of this screening mammogram will be mailed directly to the patient. RECOMMENDATION: Screening  mammogram in one year. (Code:SM-B-01Y) BI-RADS CATEGORY  1: Negative. Electronically Signed   By: Bary Richard M.D.   On: 03/20/2016 10:53   MS DIGITAL SCREENING TOMO BILATERAL  Result Date: 10/28/2019 CLINICAL DATA:  Screening. EXAM: DIGITAL SCREENING BILATERAL MAMMOGRAM WITH TOMO AND CAD COMPARISON:  Previous exam(s). ACR Breast Density Category c: The breast tissue is heterogeneously dense, which may obscure small masses. FINDINGS: There are no findings suspicious for malignancy. Images were processed with CAD. IMPRESSION: No mammographic evidence of malignancy. A result letter of this screening mammogram will be mailed directly to the patient. RECOMMENDATION: Screening mammogram in one year. (Code:SM-B-01Y) BI-RADS CATEGORY  1: Negative. Electronically Signed   By: Beckie Salts M.D.   On: 10/28/2019 16:44   MS DIGITAL SCREENING TOMO BILATERAL  Result Date: 04/28/2018 CLINICAL DATA:  Screening. EXAM: DIGITAL SCREENING BILATERAL MAMMOGRAM WITH TOMO AND CAD COMPARISON:  Previous exam(s). ACR Breast Density Category c: The breast tissue is heterogeneously dense, which may obscure small masses. FINDINGS: There are no findings suspicious for malignancy. Images were processed with CAD. IMPRESSION: No mammographic evidence of malignancy. A result letter of this screening mammogram will be mailed directly to the patient. RECOMMENDATION: Screening mammogram in one year. (Code:SM-B-01Y) BI-RADS CATEGORY  1: Negative. Electronically Signed   By: Annia Belt M.D.   On: 04/28/2018 13:40   Pelvic/Bimanual Pap is not indicated today per BCCCP guidelines.   Smoking History: Patientis a former smoker that quit in April 2019.   Patient Navigation: Patient education provided. Access to services provided for patient through [place] program. Spanish interpreter Alene Mires from [facility] provided.   Colorectal Cancer Screening: Per patient has never had a colonoscopy completed.Patient completed a FIT test 10/26/2017  and 05/16/2017 that were both negative.No complaints  today.    Breast and Cervical Cancer Risk Assessment: Patient hasafamily history of her maternal grandmother havingbreast cancer. Patient has noknown genetic mutations or history ofradiation treatment to the chest before age 27. Patient has no history of cervical dysplasia, immunocompromised, or DES exposure in-utero.  Risk Assessment    Risk Scores      12/12/2020 10/26/2019   Last edited by: Narda Rutherford, LPN Lawernce Keas, RT   5-year risk: 1 % 1 %   Lifetime risk: 6.7 % 6.8 %          A: BCCCP exam without pap smear No complaints.  P: Referred patient to the [facility] for a screening mammogram on the mobile unit. Appointment scheduled Tuesday, December 12, 2020 at 1330.  Priscille Heidelberg, RN 12/12/2020 1:14 PM

## 2020-12-12 NOTE — Patient Instructions (Addendum)
Explained breast self awareness with Roxine Caddy. Patient did not need a Pap smear today due to her history of a hysterectomy for benign reasons. Let patient know that she doesn't need any further Pap smears due to her history of a hysterectomy for benign reasons. Referred patient to the [facility] for a screening mammogram on the mobile unit. Appointment scheduled Tuesday, December 12, 2020 at 1330. Patient escorted to the mobile unit following BCCCP appointment for her screening mammogram. Let patient know the Breast Center will follow up with her within the next couple weeks with results of mammogram by letter or phone. Roxine Caddy verbalized understanding.  Wynona Duhamel, Kathaleen Maser, RN 1:14 PM

## 2021-12-06 ENCOUNTER — Other Ambulatory Visit: Payer: Self-pay

## 2021-12-06 DIAGNOSIS — Z1231 Encounter for screening mammogram for malignant neoplasm of breast: Secondary | ICD-10-CM

## 2022-02-28 ENCOUNTER — Ambulatory Visit
Admission: RE | Admit: 2022-02-28 | Discharge: 2022-02-28 | Disposition: A | Discharge status: Home / Self Care | Payer: No Typology Code available for payment source | Source: Ambulatory Visit | Attending: Obstetrics and Gynecology | Admitting: Obstetrics and Gynecology

## 2022-02-28 ENCOUNTER — Ambulatory Visit: Payer: Self-pay | Admitting: *Deleted

## 2022-02-28 VITALS — BP 148/94 | Wt 146.3 lb

## 2022-02-28 DIAGNOSIS — Z1239 Encounter for other screening for malignant neoplasm of breast: Secondary | ICD-10-CM

## 2022-02-28 DIAGNOSIS — Z1231 Encounter for screening mammogram for malignant neoplasm of breast: Secondary | ICD-10-CM

## 2022-02-28 DIAGNOSIS — Z1211 Encounter for screening for malignant neoplasm of colon: Secondary | ICD-10-CM

## 2022-02-28 NOTE — Patient Instructions (Addendum)
Explained breast self awareness with Roxine Caddy. Patient did not need a Pap smear today due to her history of a hysterectomy for benign reasons. Let patient know that she doesn't need any further Pap smears due to her history of a hysterectomy for benign reasons. Referred patient to the [facility] for a screening mammogram on the mobile unit. Appointment scheduled Thursday, February 28, 2022 at 1020. Patient aware of appointment and will be there. Let patient know the Breast Center will follow up with her within the next couple weeks with results of mammogram by letter or phone. Roxine Caddy verbalized understanding.  Ezariah Nace, Kathaleen Maser, RN 9:58 AM

## 2022-02-28 NOTE — Progress Notes (Signed)
[name] is a 61 y.o. female who presents to [facility] clinic today with no complaints.    Pap Smear: Pap smear not completed today. Last Pap smear was 10/20/2009 and normal with fungus. Per patient has no history of an abnormal Pap smear. Patient has a history of a hysterectomy 06/19/2006 for endometriosis. Patient no longer needs Pap smears due to her history of a hysterectomy for benign reasons per BCCCP and ASCCP guidelines.Last Pap smear result is available in Epic.   Physical exam: Breasts Breasts symmetrical. No skin abnormalities bilateral breasts. No nipple retraction bilateral breasts. No nipple discharge bilateral breasts. No lymphadenopathy. No lumps palpated bilateral breasts. No complaints of pain or tenderness on exam.     MS DIGITAL SCREENING TOMO BILATERAL  Result Date: 12/13/2020 CLINICAL DATA:  Screening. EXAM: DIGITAL SCREENING BILATERAL MAMMOGRAM WITH TOMOSYNTHESIS AND CAD TECHNIQUE: Bilateral screening digital craniocaudal and mediolateral oblique mammograms were obtained. Bilateral screening digital breast tomosynthesis was performed. The images were evaluated with computer-aided detection. COMPARISON:  Previous exam(s). ACR Breast Density Category b: There are scattered areas of fibroglandular density. FINDINGS: There are no findings suspicious for malignancy. The images were evaluated with computer-aided detection. IMPRESSION: No mammographic evidence of malignancy. A result letter of this screening mammogram will be mailed directly to the patient. RECOMMENDATION: Screening mammogram in one year. (Code:SM-B-01Y) BI-RADS CATEGORY  1: Negative. Electronically Signed   By: Amie Portland M.D.   On: 12/13/2020 16:24   MS DIGITAL SCREENING TOMO BILATERAL  Result Date: 10/28/2019 CLINICAL DATA:  Screening. EXAM: DIGITAL SCREENING BILATERAL MAMMOGRAM WITH TOMO AND CAD COMPARISON:  Previous exam(s). ACR Breast Density Category c: The breast tissue is heterogeneously dense,  which may obscure small masses. FINDINGS: There are no findings suspicious for malignancy. Images were processed with CAD. IMPRESSION: No mammographic evidence of malignancy. A result letter of this screening mammogram will be mailed directly to the patient. RECOMMENDATION: Screening mammogram in one year. (Code:SM-B-01Y) BI-RADS CATEGORY  1: Negative. Electronically Signed   By: Beckie Salts M.D.   On: 10/28/2019 16:44   MS DIGITAL SCREENING TOMO BILATERAL  Result Date: 04/28/2018 CLINICAL DATA:  Screening. EXAM: DIGITAL SCREENING BILATERAL MAMMOGRAM WITH TOMO AND CAD COMPARISON:  Previous exam(s). ACR Breast Density Category c: The breast tissue is heterogeneously dense, which may obscure small masses. FINDINGS: There are no findings suspicious for malignancy. Images were processed with CAD. IMPRESSION: No mammographic evidence of malignancy. A result letter of this screening mammogram will be mailed directly to the patient. RECOMMENDATION: Screening mammogram in one year. (Code:SM-B-01Y) BI-RADS CATEGORY  1: Negative. Electronically Signed   By: Annia Belt M.D.   On: 04/28/2018 13:40   MS DIGITAL SCREENING BILATERAL  Result Date: 04/24/2017 CLINICAL DATA:  Screening. EXAM: DIGITAL SCREENING BILATERAL MAMMOGRAM WITH CAD COMPARISON:  Previous exam(s). ACR Breast Density Category b: There are scattered areas of fibroglandular density. FINDINGS: There are no findings suspicious for malignancy. Images were processed with CAD. IMPRESSION: No mammographic evidence of malignancy. A result letter of this screening mammogram will be mailed directly to the patient. RECOMMENDATION: Screening mammogram in one year. (Code:SM-B-01Y) BI-RADS CATEGORY  1: Negative. Electronically Signed   By: Bary Richard M.D.   On: 04/24/2017 16:45    Pelvic/Bimanual Pap is not indicated today per BCCCP guidelines.    Smoking History: Patient is a former smoker that quit in April 2019.   Patient Navigation: Patient education  provided. Access to services provided for patient through [facility] program. Spanish interpreter Natale Lay  from [facility] provided.   Colorectal Cancer Screening: Per patient has never had a colonoscopy completed. Patient completed a FIT test 10/26/2017 and 05/16/2017 that were both negative. FIT Test given to patient to complete. No complaints today.    Breast and Cervical Cancer Risk Assessment: Patient has a family history of her maternal grandmother having breast cancer. Patient has no known genetic mutations or history of radiation treatment to the chest before age 85. Patient has no history of cervical dysplasia, immunocompromised, or DES exposure in-utero.  Risk Assessment     Risk Scores       02/28/2022 12/12/2020   Last edited by: Meryl Dare, CMA McGill, Sherie Demetrius Charity, LPN   5-year risk: 1.1 % 1 %   Lifetime risk: 6.5 % 6.7 %            A: BCCCP exam without pap smear No complaints.  P: Referred patient to the [facility] for a screening mammogram on the mobile unit. Appointment scheduled Thursday, February 28, 2022 at 1020.  Priscille Heidelberg, RN 02/28/2022 9:58 AM

## 2022-03-06 ENCOUNTER — Other Ambulatory Visit: Payer: Self-pay

## 2022-03-06 ENCOUNTER — Inpatient Hospital Stay: Payer: Self-pay | Attending: Obstetrics and Gynecology | Admitting: *Deleted

## 2022-03-06 ENCOUNTER — Ambulatory Visit: Payer: No Typology Code available for payment source

## 2022-03-06 VITALS — BP 144/90 | Ht 60.0 in | Wt 144.2 lb

## 2022-03-06 DIAGNOSIS — Z0000 Encounter for general adult medical examination without abnormal findings: Secondary | ICD-10-CM

## 2022-03-06 NOTE — Progress Notes (Addendum)
[facility] initial screening   Interpreter- Natale Lay, [employer]   Clinical Measurement: There were no vitals filed for this visit. Fasting Labs Drawn Today, will review with patient when they result.   Medical History:  Patient states that she does not have high cholesterol, does not have high blood pressure and she does not have diabetes.  Medications:  Patient states that she does not take medication to lower cholesterol, blood pressure or blood sugar.  Patient does not take an aspirin a day to help prevent a heart attack or stroke.    Blood pressure, self measurement: Patient states that she does not measure blood pressure from home. She checks her blood pressure N/A. She shares her readings with a health care provider: N/A.   Nutrition: Patient states that on average she eats 2 cups of fruit and 1 cups of vegetables per day. Patient states that she does not eat fish at least 2 times per week. Patient eats less than half servings of whole grains. Patient drinks less than 36 ounces of beverages with added sugar weekly: yes. Patient is currently watching sodium or salt intake: yes. In the past 7 days patient has consumed drinks containing alcohol on 0 days. On a day that patient consumes drinks containing alcohol on average 0 drinks are consumed.      Physical activity:  Patient states that she gets 0 minutes of moderate and 0 minutes of vigorous physical activity each week.  Smoking status:  Patient states that she has is a former smoker, quit 12+ months ago .   Quality of life:  Over the past 2 weeks patient states that she had little interest or pleasure in doing things: not at all. She has been feeling down, depressed or hopeless:not at all.    Risk reduction and counseling:   Health Coaching: Spoke with patient about increasing the amount of vegetables consumed daily. Explained to patient that the recommendation is for 3 cups of vegetables per day. Patient consumes salmon once per week.  Spoke with patient about trying to increase weekly fish intake to 2 servings per week. Patient currently consumes oatmeal but no other whole grains. Gave suggestions for other whole grains that patient can try incorporating into diet (whole wheat bread, whole wheat pasta, brown rice or whole grain cereals). Patient does not consume beverages with added sugars. Encouraged patient to continue watching sodium intake since BP was elevated today. Patient has not been exercising recently. Encouraged patient to try and start exercising daily for 20 minutes to help with lowering blood pressure and for overall health benefits. Patient also asked about ways to help with stress relief. Gave suggestion for deep breathing or meditation exercises.   Goal: Patient would like to work on lowering blood pressure. Patient would like to lower pressure to a normal range over the next 3 months.    Navigation:  I will notify patient of lab results.  Patient is aware of 2 more health coaching sessions and a follow up. Will refer patient to Internal Medicine for follow-up for elevated blood pressure.  Time: 20 minutes

## 2022-03-07 LAB — LIPID PANEL
Chol/HDL Ratio: 4.5 ratio — ABNORMAL HIGH (ref 0.0–4.4)
Cholesterol, Total: 240 mg/dL — ABNORMAL HIGH (ref 100–199)
HDL: 53 mg/dL (ref >39)
LDL Chol Calc (NIH): 165 mg/dL — ABNORMAL HIGH (ref 0–99)
Triglycerides: 124 mg/dL (ref 0–149)
VLDL Cholesterol Cal: 22 mg/dL (ref 5–40)

## 2022-03-07 LAB — GLUCOSE, RANDOM: Glucose: 104 mg/dL — ABNORMAL HIGH (ref 70–99)

## 2022-03-07 LAB — HEMOGLOBIN A1C
Est. average glucose Bld gHb Est-mCnc: 123 mg/dL
Hgb A1c MFr Bld: 5.9 % — ABNORMAL HIGH (ref 4.8–5.6)

## 2022-03-08 LAB — FECAL OCCULT BLOOD, IMMUNOCHEMICAL: Fecal Occult Bld: NEGATIVE

## 2022-03-11 ENCOUNTER — Telehealth: Payer: Self-pay

## 2022-03-11 NOTE — Telephone Encounter (Signed)
Health coaching 2   interpreter- Natale Lay, [employer]   Labs- 240 cholesterol, 165 LDL cholesterol, 124 triglycerides, 53 HDL cholesterol, 5.9 hemoglobin A1C, 104 mean plasma glucose. Patient understands and is aware of her lab results.   Goals-  1. Reduce the amount of sweets and sugars consumed. As well as carbs. 2. 20-30 minutes of daily exercise. 3. Reduce the amount of fried and fatty foods consumed. Practice a heart healthy diet.    Navigation:  Patient is aware of 1 more health coaching sessions and a follow up. Will call patient with FU appointment information for Internal Medicine once appointment has been scheduled. Will refer patient to [facility].   Time-  10 minutes

## 2022-03-26 ENCOUNTER — Encounter: Payer: Self-pay | Admitting: Student

## 2022-03-26 ENCOUNTER — Ambulatory Visit (INDEPENDENT_AMBULATORY_CARE_PROVIDER_SITE_OTHER): Payer: Self-pay | Admitting: Student

## 2022-03-26 ENCOUNTER — Other Ambulatory Visit: Payer: Self-pay

## 2022-03-26 VITALS — BP 149/70 | HR 92 | Temp 97.8°F | Ht 62.0 in | Wt 144.3 lb

## 2022-03-26 DIAGNOSIS — M542 Cervicalgia: Secondary | ICD-10-CM

## 2022-03-26 DIAGNOSIS — I10 Essential (primary) hypertension: Secondary | ICD-10-CM | POA: Insufficient documentation

## 2022-03-26 DIAGNOSIS — E7800 Pure hypercholesterolemia, unspecified: Secondary | ICD-10-CM

## 2022-03-26 DIAGNOSIS — R7303 Prediabetes: Secondary | ICD-10-CM

## 2022-03-26 DIAGNOSIS — R918 Other nonspecific abnormal finding of lung field: Secondary | ICD-10-CM

## 2022-03-26 DIAGNOSIS — Z87891 Personal history of nicotine dependence: Secondary | ICD-10-CM

## 2022-03-26 MED ORDER — LOSARTAN POTASSIUM 50 MG PO TABS
50.0000 mg | ORAL_TABLET | Freq: Every day | ORAL | 1 refills | Status: DC
Start: 2022-03-26 — End: 2022-06-05

## 2022-03-26 NOTE — Assessment & Plan Note (Signed)
Patient's most recent A1c on 06/21 was 5.9.  This puts her in the prediabetic stage.  Patient is inquisitive about what this means.  I explained to her that she does not quite have diabetes yet, but is at higher risk now.  Patient understands this.  Patient states that she has been working on her diet cutting out sugars and high fatty foods.  Patient denies exercising every day.  Patient reports that her sister has diabetes.  Patient reports that she wants to continue to make lifestyle modifications rather than go on to medications.  Plan: - Patient to make lifestyle modifications and follow-up in 2 months for her prediabetes. - A1c due in September

## 2022-03-26 NOTE — Assessment & Plan Note (Signed)
Patient also reports having neck pain for the past 3 days.  She states that this neck pain gradually came on and becomes worse with inspiration.  She states ibuprofen has helped to ease this pain.  She describes the pain to be a light pain that does not radiate anywhere.  Patient is able to rotate her head and neck in all planes with no difficulty.  Patient does have some pinpoint tenderness to her left trapezius near the SCM.  Plan: - Given the assessment and physical exam, it is likely that this is musculoskeletal in nature.  Continue with ibuprofen only on days where it is very severe.  Otherwise use lidocaine patches or Tylenol as needed for pain. - The pain continues or becomes worse consider PT/OT

## 2022-03-26 NOTE — Assessment & Plan Note (Signed)
Patient's most recent cholesterol labs on 06/21 shows a total cholesterol of 248 with LDL at 165 and HDL at 53.  Patient reports that she only eats grilled chicken and salmon.  She stays away from fatty and greasy foods.  Patient does report eating 1 egg every day.  Patient currently does not take any cholesterol-lowering medications.  ASCVD score is 4.5%.  Plan: - Given ASCVD at 4.5%, no indication for statin at this time.  Continue to monitor and make lifestyle modifications.

## 2022-03-26 NOTE — Progress Notes (Addendum)
CC: Hypercholesteremia and diabetes  HPI:  [name] is a 61 y.o. female who presents to the clinic after receiving her well woman labs.  She states that she got her labs on 06/21.  This included a lipid panel and A1c.  Patient reports that she wanted to follow-up on these labs.  Patient also reports that she has been having neck pain for the past 3 days.  See assessment and plan for more details.  History: PMHX: Patient states no past medical history. Social: Patient reports that she used to socially smoke many years ago, but does not smoke anymore.  Patient also reports drinking alcohol socially at times, but not regularly.  She denies any drug use. Surgical history: Patient reports having a cholecystectomy, hysterectomy, and an ophthalmological surgery.  Meds: Patient denies any prescription drug use.  She does report that she takes a B vitamin, vitamin D, and omega-3.  She also reports taking magnesium.  Patient's history is taken with interpreter at bedside. No past medical history on file.   Current Outpatient Medications:    losartan (COZAAR) 50 MG tablet, Take 1 tablet (50 mg total) by mouth daily., Disp: 30 tablet, Rfl: 1   Calcium Carbonate-Vitamin D (CALTRATE 600+D PO), Take 1 tablet by mouth at bedtime., Disp: , Rfl:    Cyanocobalamin (VITAMIN B-12 PO), Take 1 tablet by mouth at bedtime., Disp: , Rfl:    Omega-3 Fatty Acids (OMEGA 3 PO), Take 1 capsule by mouth at bedtime., Disp: , Rfl:    polyethylene glycol (MIRALAX) packet, Take 17 g by mouth daily. (Patient not taking: Reported on 04/28/2018), Disp: 14 each, Rfl: 0  Review of Systems:   MSK: Patient endorses neck pain.  Physical Exam:  Vitals:   03/26/22 0908 03/26/22 0924  BP: (!) 161/72 (!) 149/70  Pulse: (!) 109 92  Temp: 97.8 F (36.6 C)   TempSrc: Oral   SpO2: 98%   Weight: 144 lb 4.8 oz (65.5 kg)   Height: 5\' 2"  (1.575 m)     General: Alert and orientated x3. Patient is sitting  comfortably in the room  Eyes: Pupils equal and reactive to light, EOM intact  Head: Normocephalic, atraumatic  Neck: Left trapezius tenderness at SCM.  Full range of motion, No JVD Cardio: Regular rate and rhythm, no murmurs, rubs or gallops. 2+ pulses to bilateral upper and lower extremities  Pulmonary: Clear to ausculation bilaterally with no rales, rhonchi, and crackles  MSK: 5/5 strength to upper and lower extremities.    Assessment & Plan:   Hypertension Patient presents to the clinic with elevated blood pressures in the 160s.  On recheck the patient's blood pressure is still in the upper 140s.  Patient has a history of elevated blood pressures in the past.  She denies taking any medications for her blood pressure.  She denies any headaches or vision loss.  Upon telling her that she has a diagnosis of hypertension, patient becomes a little tearful, but understands the importance of making sure her blood pressure is under control.  She states that she was tearful because she became overwhelmed with emotion when I diagnosed her with hypertension.  She is agreeable to taking medication for this.  Patient also reports a good diet eating mostly grilled vegetables, grilled chicken, and salmon.  She also reports eating 1 egg with wheat toast in the morning with coffee with no sugar.  Patient reports that she used to exercise a lot by walking but has stopped in  the past 6 months.  I encouraged her to continue to exercise.  Plan: - Start losartan 50 mg daily - BMP showing normal kidney function - Follow-up in 1 month regarding her losartan  Pulmonary nodules Patient had some findings of incidental pulmonary nodules in 2017 on a CT of her chest.  It was recommended to follow-up with another chest CT to see if the nodules were stable.  The CT never happened.  I recommend her get another CT which she is agreeable to.  Plan: - Order CT chest.  Prediabetes Patient's most recent A1c on 06/21 was  5.9.  This puts her in the prediabetic stage.  Patient is inquisitive about what this means.  I explained to her that she does not quite have diabetes yet, but is at higher risk now.  Patient understands this.  Patient states that she has been working on her diet cutting out sugars and high fatty foods.  Patient denies exercising every day.  Patient reports that her sister has diabetes.  Patient reports that she wants to continue to make lifestyle modifications rather than go on to medications.  Plan: - Patient to make lifestyle modifications and follow-up in 2 months for her prediabetes. - A1c due in September  Hypercholesteremia Patient's most recent cholesterol labs on 06/21 shows a total cholesterol of 248 with LDL at 165 and HDL at 53.  Patient reports that she only eats grilled chicken and salmon.  She stays away from fatty and greasy foods.  Patient does report eating 1 egg every day.  Patient currently does not take any cholesterol-lowering medications.  ASCVD score is 4.5%.  Plan: - Given ASCVD at 4.5%, no indication for statin at this time.  Continue to monitor and make lifestyle modifications.  Neck pain Patient also reports having neck pain for the past 3 days.  She states that this neck pain gradually came on and becomes worse with inspiration.  She states ibuprofen has helped to ease this pain.  She describes the pain to be a light pain that does not radiate anywhere.  Patient is able to rotate her head and neck in all planes with no difficulty.  Patient does have some pinpoint tenderness to her left trapezius near the SCM.  Plan: - Given the assessment and physical exam, it is likely that this is musculoskeletal in nature.  Continue with ibuprofen only on days where it is very severe.  Otherwise use lidocaine patches or Tylenol as needed for pain. - The pain continues or becomes worse consider PT/OT   Patient seen with Dr. Letta Kocher, DO PGY-1 Internal Medicine Resident   Pager: [phone]

## 2022-03-26 NOTE — Patient Instructions (Addendum)
Gracias, [name] por permitirnos brindarle atencin hoy. hoy discutimos  He ordenado los siguientes laboratorios para usted:  Lab Orders         BMP8+Anion Gap       Pruebas ordenadas hoy:  Referencias ordenadas hoy:  Referral Orders  No referral(s) requested today     He ordenado el siguiente medicamento/cambiado los siguientes medicamentos:  Suspender los siguientes medicamentos: There are no discontinued medications.   Iniciar los siguientes medicamentos: Meds ordered this encounter  Medications   losartan (COZAAR) 50 MG tablet    Sig: Take 1 tablet (50 mg total) by mouth daily.    Dispense:  30 tablet    Refill:  1     Seguimiento  1 Month     Recordar:  Gracias por permitirme participar en su cuidado hoy. Esto es lo que discutimos.  1. Comience su losartn 50 mg al da. Haga un registro de la presin arterial para la prxima visita  2. Espere la llamada telefnica para obtener los [place] de su laboratorio.  3. Espere una llamada telefnica para su tomografa computarizada  4. Para el dolor de cuello, tome Tylenol. En los das malos toma ibuprofeno  5. Contine comiendo una dieta saludable baja en grasas y azcares.  6. Seguimiento en 1 mes   Si tiene alguna pregunta o inquietud, llame a la clnica de medicina interna al [phone].     Modena Slater, D.O. [facility]

## 2022-03-26 NOTE — Assessment & Plan Note (Addendum)
Patient presents to the clinic with elevated blood pressures in the 160s.  On recheck the patient's blood pressure is still in the upper 140s.  Patient has a history of elevated blood pressures in the past.  She denies taking any medications for her blood pressure.  She denies any headaches or vision loss.  Upon telling her that she has a diagnosis of hypertension, patient becomes a little tearful, but understands the importance of making sure her blood pressure is under control.  She states that she was tearful because she became overwhelmed with emotion when I diagnosed her with hypertension.  She is agreeable to taking medication for this.  Patient also reports a good diet eating mostly grilled vegetables, grilled chicken, and salmon.  She also reports eating 1 egg with wheat toast in the morning with coffee with no sugar.  Patient reports that she used to exercise a lot by walking but has stopped in the past 6 months.  I encouraged her to continue to exercise.  Plan: - Start losartan 50 mg daily - BMP showing normal kidney function - Follow-up in 1 month regarding her losartan

## 2022-03-26 NOTE — Assessment & Plan Note (Signed)
Patient had some findings of incidental pulmonary nodules in 2017 on a CT of her chest.  It was recommended to follow-up with another chest CT to see if the nodules were stable.  The CT never happened.  I recommend her get another CT which she is agreeable to.  Plan: - Order CT chest.

## 2022-03-27 LAB — BMP8+ANION GAP
Anion Gap: 14 mmol/L (ref 10.0–18.0)
BUN/Creatinine Ratio: 16 (ref 9–23)
BUN: 11 mg/dL (ref 6–24)
CO2: 21 mmol/L (ref 20–29)
Calcium: 9.5 mg/dL (ref 8.7–10.2)
Chloride: 103 mmol/L (ref 96–106)
Creatinine, Ser: 0.67 mg/dL (ref 0.57–1.00)
Glucose: 114 mg/dL — ABNORMAL HIGH (ref 70–99)
Potassium: 4.9 mmol/L (ref 3.5–5.2)
Sodium: 138 mmol/L (ref 134–144)
eGFR: 101 mL/min/{1.73_m2} (ref >59)

## 2022-03-27 NOTE — Progress Notes (Signed)
Internal Medicine Clinic Attending  I saw and evaluated the patient.  I personally confirmed the key portions of the history and exam documented by Dr. Patel and I reviewed pertinent patient test results.  The assessment, diagnosis, and plan were formulated together and I agree with the documentation in the resident's note.  

## 2022-03-28 ENCOUNTER — Encounter: Payer: Self-pay | Admitting: Student

## 2022-03-29 ENCOUNTER — Telehealth: Payer: Self-pay

## 2022-03-29 NOTE — Telephone Encounter (Signed)
Requesting lab results, please call pt back.  

## 2022-03-29 NOTE — Telephone Encounter (Signed)
I informed the patient of her lab results.  I state that her kidney function is normal.  I informed her that she can start taking her losartan 50 mg p.o.  She can return to clinic in 1 month.

## 2022-04-11 ENCOUNTER — Telehealth: Payer: Self-pay | Admitting: *Deleted

## 2022-04-11 NOTE — Telephone Encounter (Signed)
Spoke with patient and give her appointment for CT 04-17-2022  @ 10:00 am at [facility], phone number given to CT if patient is unable to keep appointment.

## 2022-04-17 ENCOUNTER — Other Ambulatory Visit (HOSPITAL_COMMUNITY): Payer: No Typology Code available for payment source

## 2022-05-01 ENCOUNTER — Encounter: Payer: No Typology Code available for payment source | Admitting: Student

## 2022-05-17 ENCOUNTER — Encounter: Payer: No Typology Code available for payment source | Admitting: Student

## 2022-06-04 ENCOUNTER — Other Ambulatory Visit: Payer: Self-pay

## 2022-06-04 ENCOUNTER — Ambulatory Visit: Payer: Self-pay | Admitting: Student

## 2022-06-04 ENCOUNTER — Encounter: Payer: Self-pay | Admitting: Student

## 2022-06-04 DIAGNOSIS — I10 Essential (primary) hypertension: Secondary | ICD-10-CM

## 2022-06-04 DIAGNOSIS — Z87891 Personal history of nicotine dependence: Secondary | ICD-10-CM

## 2022-06-04 NOTE — Patient Instructions (Signed)
[name], it was a pleasure seeing you today!  Today we discussed: - Blood pressure: I am glad your blood pressure has been good at home! Since it has been normal off of medication, please do not take any more losartan. We can hold off any further labs today.  Follow-up: 6 months   Please make sure to arrive 15 minutes prior to your next appointment. If you arrive late, you may be asked to reschedule.   We look forward to seeing you next time. Please call our clinic at [phone] if you have any questions or concerns. The best time to call is Monday-Friday from 9am-4pm, but there is someone available 24/7. If after hours or the weekend, call the main hospital number and ask for the Internal Medicine Resident On-Call. If you need medication refills, please notify your pharmacy one week in advance and they will send [place] a request.  Thank you for letting us take part in your care. Wishing you the best!  Thank you, Sanjuan Dame, MD

## 2022-06-05 NOTE — Progress Notes (Signed)
   CC: hypertension follow-up  HPI:  [name] is a 61 y.o. person with hyperlipidemia, prediabetes presenting to [facility] for hypertension follow-up  Please see problem-based list for further details, assessments, and plans.  No past medical history on file.  Review of Systems:  As per HPI  Physical Exam:  Vitals:   06/04/22 0910 06/04/22 0929  BP: (!) 152/88 (!) 144/80  Pulse: (!) 120 92  Temp: 98.1 F (36.7 C)   TempSrc: Oral   SpO2: 100%   Weight: 143 lb 4.8 oz (65 kg)   Height: 5\' 2"  (1.575 m)    General: Resting comfortably in no acute distress CV: Regular rate, rhythm. No murmurs appreciated.  Pulm: Normal work of breathing on room air. Clear to ausculation bilaterally.  Neuro: Awake, alert, conversing appropriately. Grossly non-focal.   Assessment & Plan:   Hypertension [name] is presenting today for follow-up for hypertension. She is accompanied by her daughter, who is translating for her. Since last visit, [name] only took the blood pressure medication for a few days then stopped. She got a blood pressure cuff and has been logging them twice daily. Upon review, it appears her blood pressures have been at goal every day at home, systolic 454-098 with diastolic in 11'B. She has not had chest pain, dyspnea, lightheadedness.  Patient's daughter did mention that [name] gets quite nervous coming to the doctor's office, which correlates with her initial HR 120 today. Discussed that I do not believe she needs to take the blood pressure medication since she has been controlled without taking it. She agreed to plan. We will follow-up with her in 6 months.  - STOP losartan - Continue blood pressure log - Follow-up in six months  Patient discussed with Dr. Andrey Spearman, MD Internal Medicine PGY-3 Pager: [phone]

## 2022-06-05 NOTE — Assessment & Plan Note (Addendum)
[name] is presenting today for follow-up for hypertension. She is accompanied by her daughter, who is translating for her. Since last visit, [name] only took the blood pressure medication for a few days then stopped. She got a blood pressure cuff and has been logging them twice daily. Upon review, it appears her blood pressures have been at goal every day at home, systolic 940-768 with diastolic in 08'U. She has not had chest pain, dyspnea, lightheadedness.  Patient's daughter did mention that [name] gets quite nervous coming to the doctor's office, which correlates with her initial HR 120 today. Discussed that I do not believe she needs to take the blood pressure medication since she has been controlled without taking it. She agreed to plan. We will follow-up with her in 6 months.  - STOP losartan - Continue blood pressure log - Follow-up in six months

## 2022-06-10 ENCOUNTER — Telehealth: Payer: Self-pay

## 2022-06-10 NOTE — Telephone Encounter (Signed)
Left message for patient about completing HC 3 for the [employer] [facility]. Left name and number for patient to call back. 

## 2022-06-12 NOTE — Progress Notes (Signed)
Internal Medicine Clinic Attending ? ?Case discussed with Dr. Braswell  at the time of the visit.  We reviewed the resident?s history and exam and pertinent patient test results.  I agree with the assessment, diagnosis, and plan of care documented in the resident?s note.  ?

## 2023-01-29 ENCOUNTER — Encounter: Payer: Self-pay | Admitting: Student

## 2023-03-31 ENCOUNTER — Telehealth: Payer: Self-pay | Admitting: Student

## 2023-03-31 NOTE — Telephone Encounter (Signed)
Attempted to call twice with no answer for expiring CT for lung nodule follow up. Patient did not pick up. Order did expire.

## 2024-01-03 IMAGING — MG MM DIGITAL SCREENING BILAT W/ TOMO AND CAD
8 series · 8 of 24 positions shown · non-contrast
Comparison: Previous exam(s).

CLINICAL DATA: Screening.

EXAM:
DIGITAL SCREENING BILATERAL MAMMOGRAM WITH TOMOSYNTHESIS AND CAD
TECHNIQUE: Bilateral screening digital craniocaudal and mediolateral oblique
mammograms were obtained. Bilateral screening digital breast
tomosynthesis was performed. The images were evaluated with
computer-aided detection.

[R CC synth-2D]
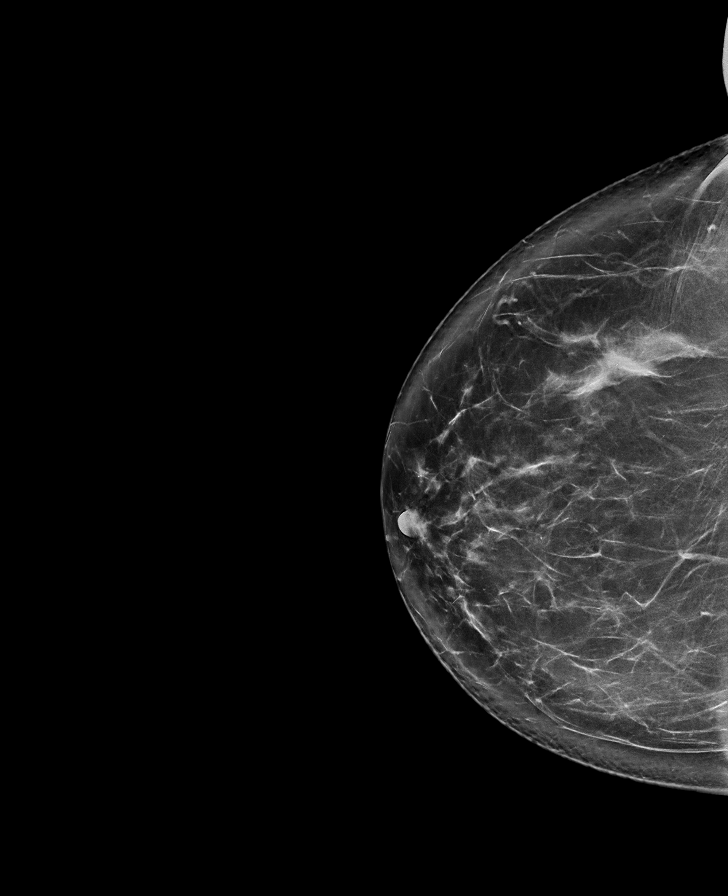

[L MLO synth-2D]
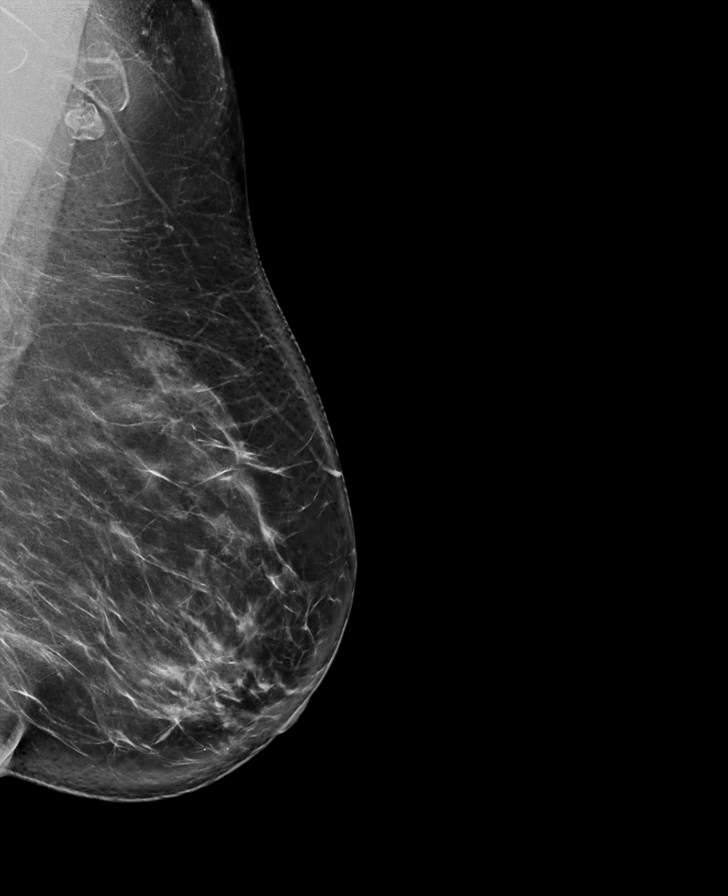

[R MLO synth-2D]
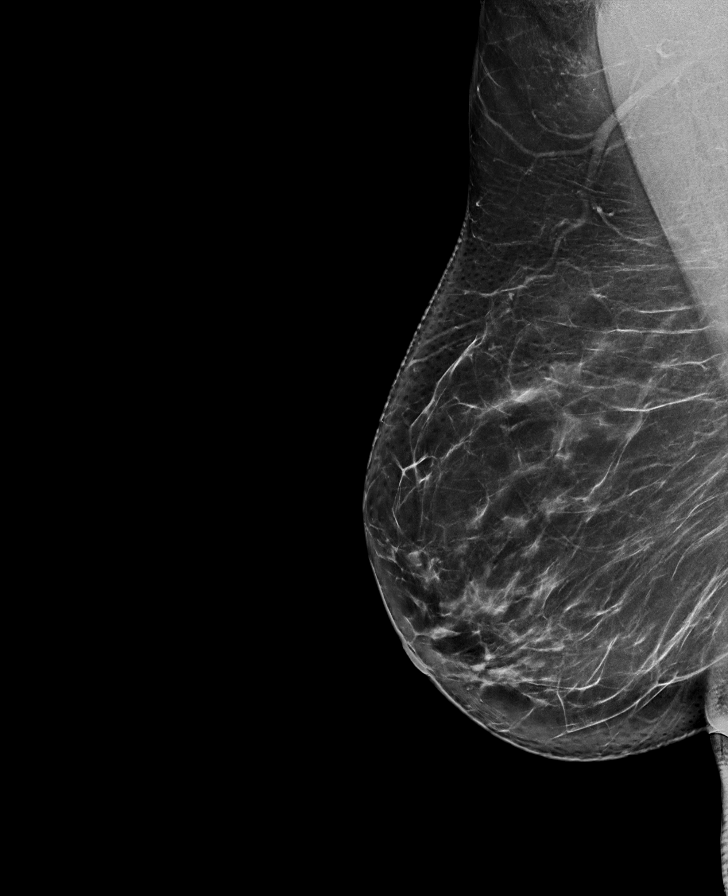

[L CC synth-2D]
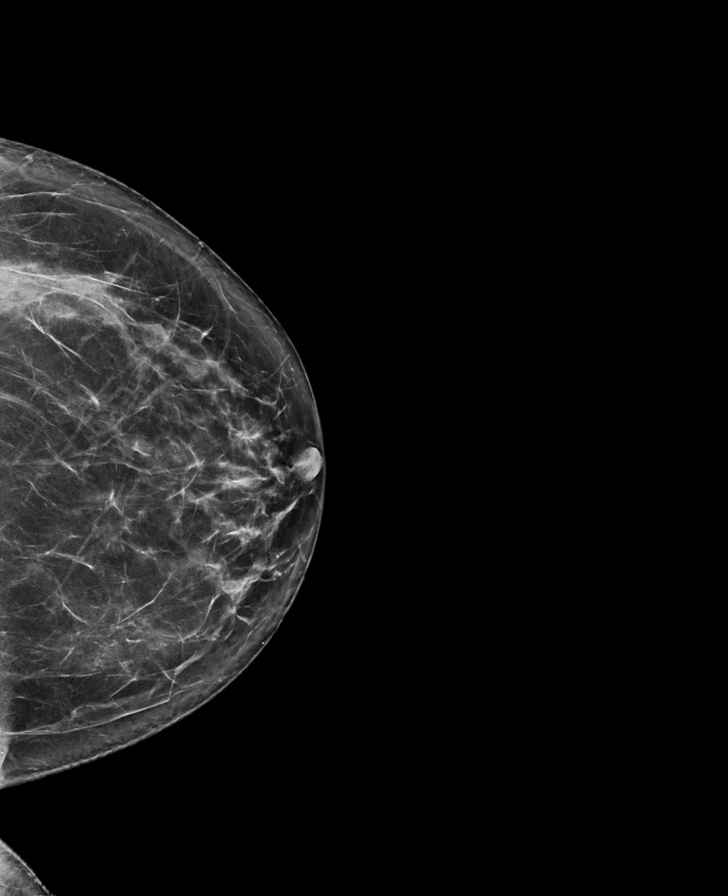

[R CC tomo · tomo slice 45/89.0]
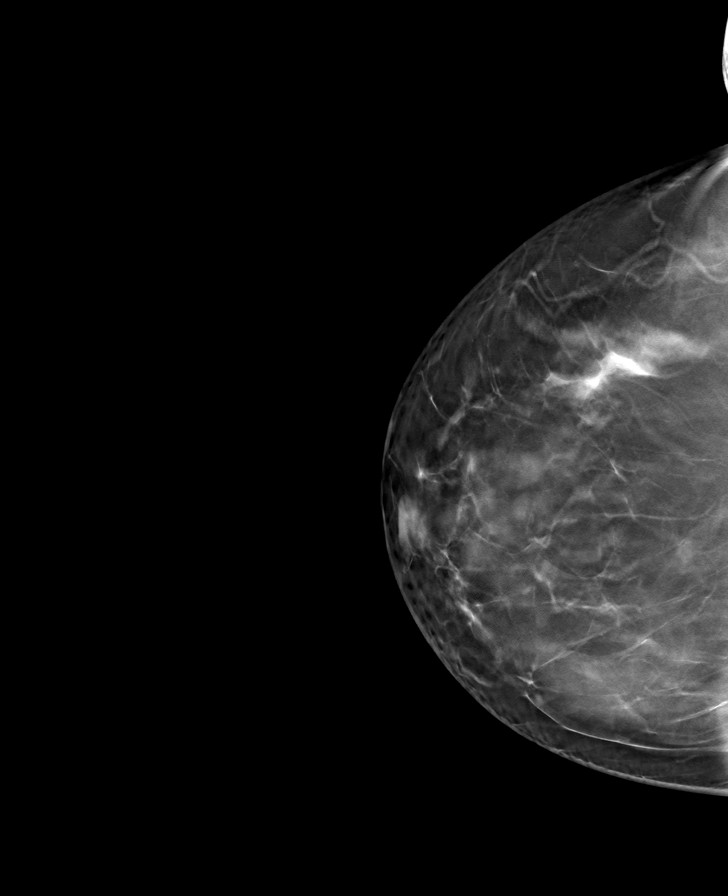

[R MLO tomo · tomo slice 45/90.0]
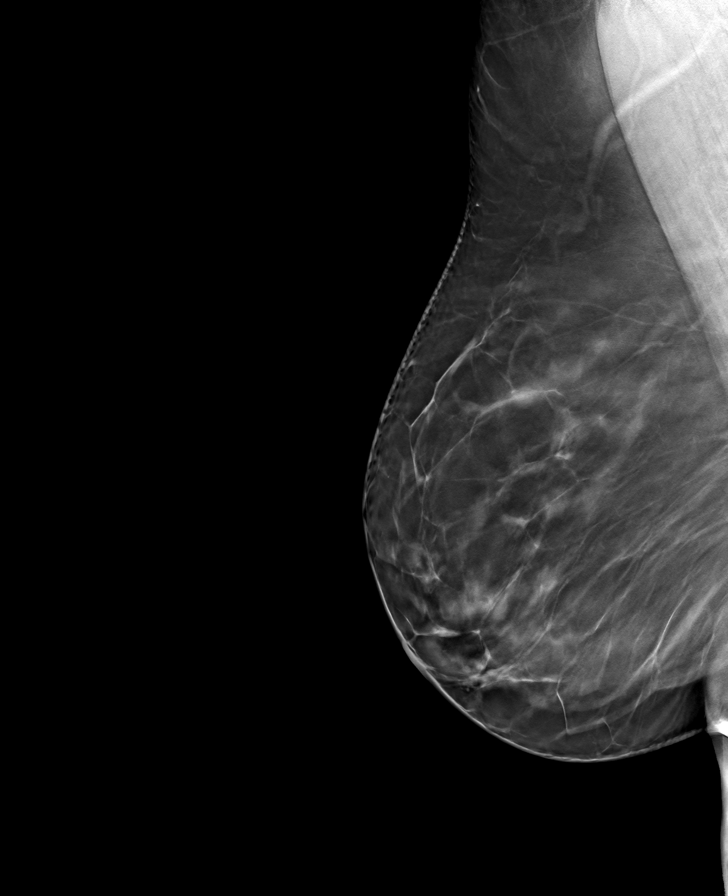

[L MLO tomo · tomo slice 47/93.0]
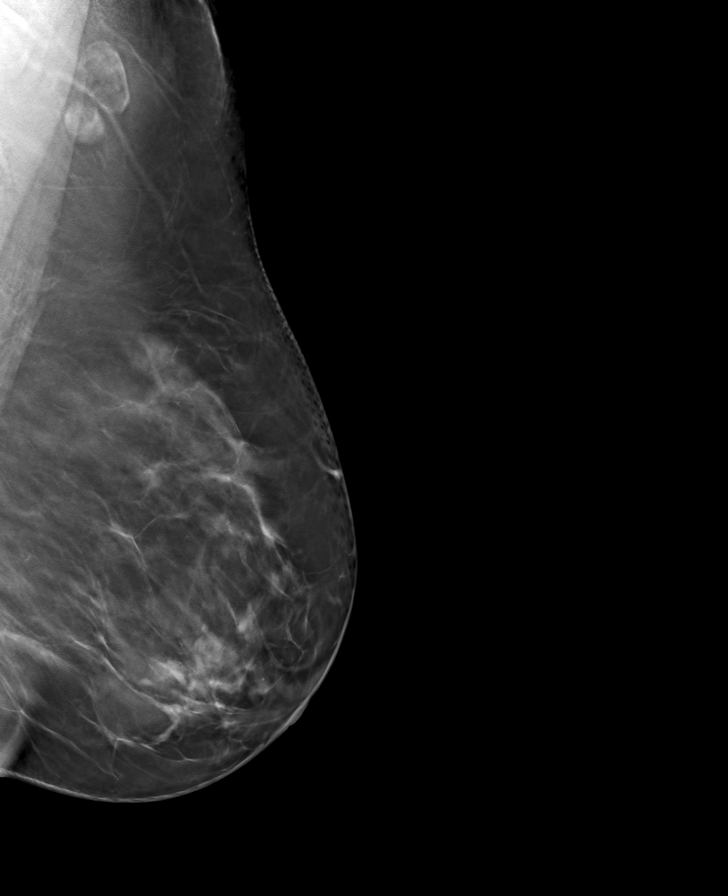

[L CC tomo · tomo slice 41/81.0]
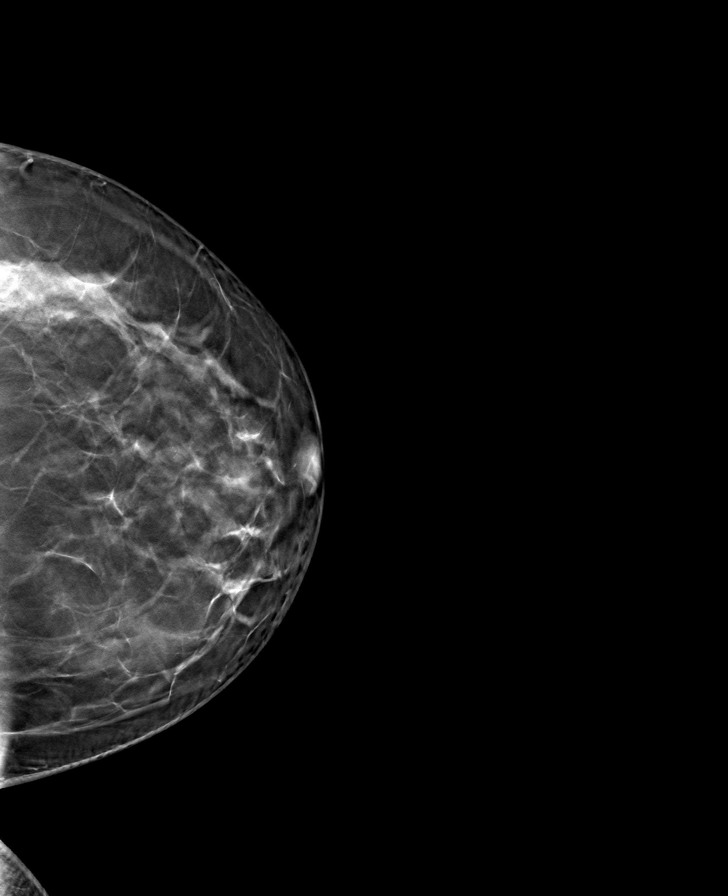

[8 of 24 positions shown; findings below may reference images not displayed]

ACR Breast Density Category b: There are scattered areas of
fibroglandular density.
FINDINGS: There are no findings suspicious for malignancy.
IMPRESSION: No mammographic evidence of malignancy. A result letter of this
screening mammogram will be mailed directly to the patient.

RECOMMENDATION:
Screening mammogram in one year. (Code:51-O-LD2)

BI-RADS CATEGORY  1: Negative.
